# Patient Record
Sex: Female | Born: 1970 | Race: White | Hispanic: No | Marital: Single | Smoking: Former smoker
Health system: Southern US, Community
[De-identification: ages and names within clinical notes are randomized; demographics above are authoritative.]

## PROBLEM LIST (undated history)

## (undated) DIAGNOSIS — K9041 Non-celiac gluten sensitivity: Secondary | ICD-10-CM

## (undated) DIAGNOSIS — Z87891 Personal history of nicotine dependence: Secondary | ICD-10-CM

## (undated) DIAGNOSIS — I10 Essential (primary) hypertension: Secondary | ICD-10-CM

## (undated) DIAGNOSIS — F329 Major depressive disorder, single episode, unspecified: Secondary | ICD-10-CM

## (undated) DIAGNOSIS — Z973 Presence of spectacles and contact lenses: Secondary | ICD-10-CM

## (undated) DIAGNOSIS — S82409A Unspecified fracture of shaft of unspecified fibula, initial encounter for closed fracture: Secondary | ICD-10-CM

## (undated) DIAGNOSIS — Z87442 Personal history of urinary calculi: Secondary | ICD-10-CM

## (undated) DIAGNOSIS — J189 Pneumonia, unspecified organism: Secondary | ICD-10-CM

## (undated) DIAGNOSIS — N946 Dysmenorrhea, unspecified: Secondary | ICD-10-CM

## (undated) DIAGNOSIS — S8990XA Unspecified injury of unspecified lower leg, initial encounter: Secondary | ICD-10-CM

## (undated) DIAGNOSIS — F32A Depression, unspecified: Secondary | ICD-10-CM

## (undated) HISTORY — DX: Pneumonia, unspecified organism: J18.9

## (undated) HISTORY — PX: REDUCTION MAMMAPLASTY: SUR839

## (undated) HISTORY — DX: Depression, unspecified: F32.A

## (undated) HISTORY — DX: Non-celiac gluten sensitivity: K90.41

## (undated) HISTORY — DX: Personal history of nicotine dependence: Z87.891

## (undated) HISTORY — PX: MOUTH SURGERY: SHX715

## (undated) HISTORY — DX: Unspecified fracture of shaft of unspecified fibula, initial encounter for closed fracture: S82.409A

## (undated) HISTORY — DX: Dysmenorrhea, unspecified: N94.6

## (undated) HISTORY — DX: Personal history of urinary calculi: Z87.442

## (undated) HISTORY — DX: Major depressive disorder, single episode, unspecified: F32.9

---

## 1998-06-05 ENCOUNTER — Other Ambulatory Visit: Admission: RE | Admit: 1998-06-05 | Discharge: 1998-06-05 | Payer: Self-pay | Admitting: Obstetrics and Gynecology

## 1999-03-17 HISTORY — PX: BREAST REDUCTION SURGERY: SHX8

## 1999-06-10 ENCOUNTER — Other Ambulatory Visit: Admission: RE | Admit: 1999-06-10 | Discharge: 1999-06-10 | Payer: Self-pay | Admitting: Obstetrics and Gynecology

## 1999-11-25 ENCOUNTER — Ambulatory Visit (HOSPITAL_BASED_OUTPATIENT_CLINIC_OR_DEPARTMENT_OTHER): Admission: RE | Admit: 1999-11-25 | Discharge: 1999-11-25 | Payer: Self-pay | Admitting: Plastic Surgery

## 1999-11-25 ENCOUNTER — Encounter (INDEPENDENT_AMBULATORY_CARE_PROVIDER_SITE_OTHER): Payer: Self-pay | Admitting: Specialist

## 2000-03-02 ENCOUNTER — Encounter (INDEPENDENT_AMBULATORY_CARE_PROVIDER_SITE_OTHER): Payer: Self-pay

## 2000-03-02 ENCOUNTER — Other Ambulatory Visit: Admission: RE | Admit: 2000-03-02 | Discharge: 2000-03-02 | Payer: Self-pay | Admitting: Plastic Surgery

## 2000-06-29 ENCOUNTER — Other Ambulatory Visit: Admission: RE | Admit: 2000-06-29 | Discharge: 2000-06-29 | Payer: Self-pay | Admitting: Obstetrics and Gynecology

## 2001-06-30 ENCOUNTER — Other Ambulatory Visit: Admission: RE | Admit: 2001-06-30 | Discharge: 2001-06-30 | Payer: Self-pay | Admitting: Obstetrics and Gynecology

## 2004-10-29 ENCOUNTER — Encounter: Admission: RE | Admit: 2004-10-29 | Discharge: 2004-10-29 | Payer: Self-pay | Admitting: Obstetrics and Gynecology

## 2006-05-10 ENCOUNTER — Ambulatory Visit: Payer: Self-pay | Admitting: Internal Medicine

## 2006-07-09 ENCOUNTER — Ambulatory Visit: Payer: Self-pay | Admitting: Internal Medicine

## 2006-07-09 LAB — CONVERTED CEMR LAB
BUN: 6 mg/dL (ref 6–23)
Basophils Absolute: 0 10*3/uL (ref 0.0–0.1)
Basophils Relative: 0.1 % (ref 0.0–1.0)
Bilirubin, Direct: 0.1 mg/dL (ref 0.0–0.3)
Calcium: 8.8 mg/dL (ref 8.4–10.5)
Chloride: 108 meq/L (ref 96–112)
Eosinophils Absolute: 0.1 10*3/uL (ref 0.0–0.6)
HDL: 43.3 mg/dL (ref >39.0)
Lymphocytes Relative: 38.3 % (ref 12.0–46.0)
MCHC: 34.6 g/dL (ref 30.0–36.0)
MCV: 92.3 fL (ref 78.0–100.0)
Monocytes Absolute: 0.3 10*3/uL (ref 0.2–0.7)
Monocytes Relative: 6.7 % (ref 3.0–11.0)
Neutrophils Relative %: 53.3 % (ref 43.0–77.0)
Platelets: 283 10*3/uL (ref 150–400)
RBC: 4.56 M/uL (ref 3.87–5.11)
TSH: 1.88 microintl units/mL (ref 0.35–5.50)
WBC: 4.9 10*3/uL (ref 4.5–10.5)

## 2006-07-16 ENCOUNTER — Ambulatory Visit: Payer: Self-pay | Admitting: Internal Medicine

## 2006-08-24 ENCOUNTER — Other Ambulatory Visit: Admission: RE | Admit: 2006-08-24 | Discharge: 2006-08-24 | Payer: Self-pay | Admitting: Internal Medicine

## 2006-08-24 ENCOUNTER — Encounter: Payer: Self-pay | Admitting: Internal Medicine

## 2006-08-24 ENCOUNTER — Ambulatory Visit: Payer: Self-pay | Admitting: Internal Medicine

## 2006-11-05 ENCOUNTER — Ambulatory Visit: Payer: Self-pay | Admitting: Internal Medicine

## 2006-11-05 DIAGNOSIS — Z8659 Personal history of other mental and behavioral disorders: Secondary | ICD-10-CM

## 2006-11-05 DIAGNOSIS — N946 Dysmenorrhea, unspecified: Secondary | ICD-10-CM | POA: Insufficient documentation

## 2006-11-05 DIAGNOSIS — N926 Irregular menstruation, unspecified: Secondary | ICD-10-CM

## 2006-11-05 DIAGNOSIS — F329 Major depressive disorder, single episode, unspecified: Secondary | ICD-10-CM

## 2006-12-28 ENCOUNTER — Ambulatory Visit: Payer: Self-pay | Admitting: Internal Medicine

## 2006-12-28 DIAGNOSIS — L723 Sebaceous cyst: Secondary | ICD-10-CM | POA: Insufficient documentation

## 2006-12-28 DIAGNOSIS — L989 Disorder of the skin and subcutaneous tissue, unspecified: Secondary | ICD-10-CM | POA: Insufficient documentation

## 2007-02-28 ENCOUNTER — Ambulatory Visit: Payer: Self-pay | Admitting: Internal Medicine

## 2007-02-28 DIAGNOSIS — G4700 Insomnia, unspecified: Secondary | ICD-10-CM

## 2007-03-25 ENCOUNTER — Encounter: Payer: Self-pay | Admitting: Internal Medicine

## 2007-03-28 ENCOUNTER — Encounter: Payer: Self-pay | Admitting: Internal Medicine

## 2007-08-31 ENCOUNTER — Ambulatory Visit: Payer: Self-pay | Admitting: Internal Medicine

## 2007-08-31 DIAGNOSIS — M79609 Pain in unspecified limb: Secondary | ICD-10-CM

## 2007-08-31 DIAGNOSIS — M542 Cervicalgia: Secondary | ICD-10-CM | POA: Insufficient documentation

## 2007-09-12 ENCOUNTER — Ambulatory Visit: Payer: Self-pay | Admitting: Internal Medicine

## 2007-09-12 DIAGNOSIS — R209 Unspecified disturbances of skin sensation: Secondary | ICD-10-CM | POA: Insufficient documentation

## 2007-09-18 ENCOUNTER — Encounter: Admission: RE | Admit: 2007-09-18 | Discharge: 2007-09-18 | Payer: Self-pay | Admitting: Internal Medicine

## 2007-09-19 ENCOUNTER — Telehealth (INDEPENDENT_AMBULATORY_CARE_PROVIDER_SITE_OTHER): Payer: Self-pay | Admitting: *Deleted

## 2007-09-23 ENCOUNTER — Encounter: Payer: Self-pay | Admitting: Internal Medicine

## 2007-09-23 ENCOUNTER — Encounter: Admission: RE | Admit: 2007-09-23 | Discharge: 2007-09-23 | Payer: Self-pay | Admitting: Neurology

## 2008-03-21 ENCOUNTER — Telehealth: Payer: Self-pay | Admitting: *Deleted

## 2008-07-17 ENCOUNTER — Ambulatory Visit: Payer: Self-pay | Admitting: Internal Medicine

## 2008-07-17 LAB — CONVERTED CEMR LAB
ALT: 34 units/L (ref 0–35)
AST: 25 units/L (ref 0–37)
Alkaline Phosphatase: 46 units/L (ref 39–117)
Basophils Absolute: 0 10*3/uL (ref 0.0–0.1)
Bilirubin, Direct: 0.1 mg/dL (ref 0.0–0.3)
Eosinophils Absolute: 0.1 10*3/uL (ref 0.0–0.7)
Eosinophils Relative: 1.8 % (ref 0.0–5.0)
GFR calc non Af Amer: 99.28 mL/min (ref >60)
HCT: 40.4 % (ref 36.0–46.0)
LDL Cholesterol: 95 mg/dL (ref 0–99)
MCHC: 35.1 g/dL (ref 30.0–36.0)
Monocytes Absolute: 0.3 10*3/uL (ref 0.1–1.0)
Neutro Abs: 3.1 10*3/uL (ref 1.4–7.7)
Nitrite: POSITIVE
RBC: 4.27 M/uL (ref 3.87–5.11)
RDW: 11.7 % (ref 11.5–14.6)
Sodium: 140 meq/L (ref 135–145)
Specific Gravity, Urine: 1.01
TSH: 2.65 microintl units/mL (ref 0.35–5.50)
Total Bilirubin: 0.8 mg/dL (ref 0.3–1.2)
Total Protein: 6.2 g/dL (ref 6.0–8.3)
Triglycerides: 154 mg/dL — ABNORMAL HIGH (ref 0.0–149.0)
VLDL: 30.8 mg/dL (ref 0.0–40.0)
WBC Urine, dipstick: NEGATIVE
pH: 6

## 2008-07-23 ENCOUNTER — Ambulatory Visit: Payer: Self-pay | Admitting: Internal Medicine

## 2008-07-23 ENCOUNTER — Other Ambulatory Visit: Admission: RE | Admit: 2008-07-23 | Discharge: 2008-07-23 | Payer: Self-pay | Admitting: Internal Medicine

## 2008-07-23 ENCOUNTER — Encounter: Payer: Self-pay | Admitting: Internal Medicine

## 2008-07-23 DIAGNOSIS — E669 Obesity, unspecified: Secondary | ICD-10-CM

## 2008-08-29 ENCOUNTER — Ambulatory Visit: Payer: Self-pay | Admitting: Family Medicine

## 2008-08-29 DIAGNOSIS — N3000 Acute cystitis without hematuria: Secondary | ICD-10-CM

## 2008-08-29 LAB — CONVERTED CEMR LAB
Glucose, Urine, Semiquant: NEGATIVE
Ketones, urine, test strip: NEGATIVE

## 2008-08-30 ENCOUNTER — Encounter: Payer: Self-pay | Admitting: Family Medicine

## 2008-11-13 ENCOUNTER — Ambulatory Visit: Payer: Self-pay | Admitting: Internal Medicine

## 2008-11-13 DIAGNOSIS — R5381 Other malaise: Secondary | ICD-10-CM

## 2008-11-13 DIAGNOSIS — IMO0001 Reserved for inherently not codable concepts without codable children: Secondary | ICD-10-CM

## 2008-11-13 DIAGNOSIS — R5383 Other fatigue: Secondary | ICD-10-CM

## 2008-11-13 LAB — CONVERTED CEMR LAB
CO2: 25 meq/L (ref 19–32)
Creatinine, Ser: 0.7 mg/dL (ref 0.4–1.2)
Folate: >20 ng/mL
Free T4: 0.7 ng/dL (ref 0.6–1.6)
GFR calc non Af Amer: 99.11 mL/min (ref >60)
Potassium: 3.9 meq/L (ref 3.5–5.1)
T3, Free: 3.5 pg/mL (ref 2.3–4.2)
Thyroperoxidase Ab SerPl-aCnc: 86.9 — ABNORMAL HIGH (ref 0.0–60.0)
Tissue Transglutaminase Ab, IgA: 0.3 units (ref <7)
Total CK: 68 units/L (ref 7–177)

## 2008-11-15 ENCOUNTER — Telehealth: Payer: Self-pay | Admitting: *Deleted

## 2008-11-22 ENCOUNTER — Ambulatory Visit: Payer: Self-pay | Admitting: Internal Medicine

## 2008-11-22 DIAGNOSIS — F0630 Mood disorder due to known physiological condition, unspecified: Secondary | ICD-10-CM

## 2008-11-22 DIAGNOSIS — R946 Abnormal results of thyroid function studies: Secondary | ICD-10-CM | POA: Insufficient documentation

## 2009-01-30 ENCOUNTER — Ambulatory Visit: Payer: Self-pay | Admitting: Internal Medicine

## 2009-01-30 DIAGNOSIS — S8990XA Unspecified injury of unspecified lower leg, initial encounter: Secondary | ICD-10-CM | POA: Insufficient documentation

## 2009-01-30 DIAGNOSIS — S82409A Unspecified fracture of shaft of unspecified fibula, initial encounter for closed fracture: Secondary | ICD-10-CM | POA: Insufficient documentation

## 2009-01-30 DIAGNOSIS — S99929A Unspecified injury of unspecified foot, initial encounter: Secondary | ICD-10-CM

## 2009-01-30 DIAGNOSIS — S99919A Unspecified injury of unspecified ankle, initial encounter: Secondary | ICD-10-CM

## 2009-02-13 ENCOUNTER — Ambulatory Visit: Payer: Self-pay | Admitting: Internal Medicine

## 2009-02-28 ENCOUNTER — Encounter: Payer: Self-pay | Admitting: Internal Medicine

## 2009-03-29 ENCOUNTER — Encounter: Payer: Self-pay | Admitting: Internal Medicine

## 2009-05-07 ENCOUNTER — Ambulatory Visit: Payer: Self-pay | Admitting: Internal Medicine

## 2009-07-29 ENCOUNTER — Other Ambulatory Visit: Admission: RE | Admit: 2009-07-29 | Discharge: 2009-07-29 | Payer: Self-pay | Admitting: Internal Medicine

## 2009-07-29 ENCOUNTER — Ambulatory Visit: Payer: Self-pay | Admitting: Internal Medicine

## 2009-07-29 DIAGNOSIS — S6990XA Unspecified injury of unspecified wrist, hand and finger(s), initial encounter: Secondary | ICD-10-CM | POA: Insufficient documentation

## 2009-07-29 DIAGNOSIS — S6980XA Other specified injuries of unspecified wrist, hand and finger(s), initial encounter: Secondary | ICD-10-CM

## 2009-08-02 LAB — CONVERTED CEMR LAB: Pap Smear: NEGATIVE

## 2009-10-02 ENCOUNTER — Telehealth: Payer: Self-pay | Admitting: Internal Medicine

## 2009-10-18 ENCOUNTER — Ambulatory Visit: Payer: Self-pay | Admitting: Internal Medicine

## 2010-01-09 ENCOUNTER — Ambulatory Visit: Payer: Self-pay | Admitting: Internal Medicine

## 2010-04-02 ENCOUNTER — Ambulatory Visit
Admission: RE | Admit: 2010-04-02 | Discharge: 2010-04-02 | Payer: Self-pay | Source: Home / Self Care | Attending: Internal Medicine | Admitting: Internal Medicine

## 2010-04-15 NOTE — Letter (Signed)
Summary: [facility]  [facility]   Imported By: Sherian Rein 04/08/2009 13:02:09  _____________________________________________________________________  External Attachment:    Type:   Image     Comment:   External Document

## 2010-04-15 NOTE — Letter (Signed)
Summary: [facility]  [facility]   Imported By: Maryln Gottron 03/20/2009 10:25:45  _____________________________________________________________________  External Attachment:    Type:   Image     Comment:   External Document

## 2010-04-15 NOTE — Progress Notes (Signed)
Summary: Period on depo  Phone Note Call from Patient Call back at Home Phone [phone]   Caller: Patient Summary of Call: Pt left a voicemail saying that she is on the depo shot but has started her period on 7/15 and it has been going on since then. I left a message for her to call us back. Initial call taken by: Romualdo Bolk, CMA Duncan Dull),  October 02, 2009 8:37 AM  Follow-up for Phone Call        Spoke to pt- Pt has been on Depo since last year. She hasn't had her period since been on Depo. Pt started period on 7/15. It is not heavy. She has been wearing tampons, dark and painful. Pt states that it is like a normal period. Pt's next depo is due Aug 5th. Pt states that she is under alot of stress. Pt has been on Depo in the past and this never happened before. Follow-up by: Romualdo Bolk, CMA Duncan Dull),  October 02, 2009 4:56 PM  Additional Follow-up for Phone Call Additional follow up Details #1::        Per Dr. Fabian Sharp- Call if more than 10-14 days but should be okay.  Pt is not using this for contraception. Pt to call us back if she is having this for more than 10-14 days. Additional Follow-up by: Romualdo Bolk, CMA (AAMA),  October 02, 2009 5:20 PM

## 2010-04-15 NOTE — Assessment & Plan Note (Signed)
Summary: DEPO INJ//SLM  Nurse Visit   Vital Signs:  Patient profile:   39 year old female Menstrual status:  depo LMP:     09/27/2009 Weight:      174 pounds Pulse rate:   78 / minute BP sitting:   120 / 80  (right arm)  Vitals Entered By: Romualdo Bolk, CMA (AAMA) (October 18, 2009 11:07 AM) CC: Pt states that she did have a period on 7/15-7/20 and has been under stress. Pt states that she thinks this brought on her period. She also changed Cymbalta from 60mg  to 90mg  LMP (date): 09/27/2009 LMP - Character: light Menarche (age onset years): 9   Menses interval (days): 84 Menstrual flow (days): 1-2 Enter LMP: 09/27/2009 Last PAP Result NEGATIVE FOR INTRAEPITHELIAL LESIONS OR MALIGNANCY.   Preventive Screening-Counseling & Management  Alcohol-Tobacco     Alcohol drinks/day: <1     Alcohol type: wine     Smoking Status: quit     Year Quit: 8 years     Passive Smoke Exposure: no  Caffeine-Diet-Exercise     Caffeine use/day: <1     Does Patient Exercise: yes     Type of exercise: treadmill, walking     Times/week: 4     Depression Counseling: further diagnostic testing and/or other treatment is indicated  Current Medications (verified): 1)  Depo-Provera 150 Mg/ml Susp (Medroxyprogesterone Acetate) 2)  Lamictal 100 Mg  Tabs (Lamotrigine) .Marland Kitchen.. 1  By Mouth Qam and 1 By Mouth At Bedtime. Dr. Nolen Mu 3)  Clonazepam 0.5 Mg  Tabs (Clonazepam) .... 1/2 To 1 Once Daily Dr. Nolen Mu 4)  Cymbalta 30 Mg Cpep (Duloxetine Hcl) .... 3 By Mouth Once Daily  Allergies (verified): 1)  ! Penicillin 2)  ! * Gluten  Patient Instructions: 1)  Next Depo Due 01/09/2010   Medication Administration  Injection # 1:    Medication: Depo-Provera 150mg     Diagnosis: CONTRACEPTIVE MANAGEMENT (ICD-V25.09)    Route: IM    Site: RUOQ gluteus    Exp Date: 05/14/2012    Lot #: [id number]    Mfr: [email or web address]    Patient tolerated injection without complications    Given by: Romualdo Bolk,  CMA (AAMA) (October 18, 2009 11:14 AM)  Orders Added: 1)  Depo-Provera 150mg  [J1055] 2)  Admin of Therapeutic Inj  intramuscular or subcutaneous [96372] 3)  Est. Patient Level I [98119]  Impression & Recommendations:  Problem # 1:  IRREGULAR MENSTRUATION (ICD-626.4) prob frrom the depo  no intervention  needed . MOnitor   menses and call as needed concerns  Her updated medication list for this problem includes:    Depo-provera 150 Mg/ml Susp (Medroxyprogesterone acetate)  Problem # 2:  CONTRACEPTIVE MANAGEMENT (ICD-V25.09)  Orders: Depo-Provera 150mg  (J1055) Admin of Therapeutic Inj  intramuscular or subcutaneous (14782)  Complete Medication List: 1)  Depo-provera 150 Mg/ml Susp (Medroxyprogesterone acetate) 2)  Lamictal 100 Mg Tabs (Lamotrigine) .Marland Kitchen.. 1  by mouth qam and 1 by mouth at bedtime. dr. Nolen Mu 3)  Clonazepam 0.5 Mg Tabs (Clonazepam) .... 1/2 to 1 once daily dr. Nolen Mu 4)  Cymbalta 30 Mg Cpep (Duloxetine hcl) .... 3 by mouth once daily   History of Present Illness: see nursing hx .   advice requested and hx reviewed .   Ot denies risk of pregnancy.

## 2010-04-15 NOTE — Assessment & Plan Note (Signed)
Summary: fup/ccm   Vital Signs:  Patient profile:   40 year old female Menstrual status:  depo Height:      59.5 inches Weight:      154.6 pounds Temp:     98.2 degrees F oral Pulse rate:   86 / minute BP sitting:   110 / 80  (right arm)  Vitals Entered By: Kathrynn Speed CMA (Jul 29, 2009 3:22 PM) CC: CPX with pap- Pt smashed finger in door   History of Present Illness: Elizabeth Tanner comesin comes in today  for preventive visit . Since last visit  here  there have been no major changes in health status  . She is doing better on  new med changes and depression  per Valinda Hoar.   She injured smashed her left index finger  3 weels ago and bruised and slightly ttender  . using peroxide soaks . hx of oozing  .  On depo and is amenorrheic and prefers this .    Preventive Care Screening  Prior Values:    Pap Smear:  NEGATIVE FOR INTRAEPITHELIAL LESIONS OR MALIGNANCY. (07/23/2008)    Last Tetanus Booster:  Tdap (07/23/2008)   Preventive Screening-Counseling & Management  Alcohol-Tobacco     Alcohol drinks/day: <1     Alcohol type: wine     Smoking Status: quit     Year Quit: 8 years     Passive Smoke Exposure: no  Caffeine-Diet-Exercise     Caffeine use/day: <1     Does Patient Exercise: yes     Type of exercise: treadmill, walking     Times/week: 4     Depression Counseling: further diagnostic testing and/or other treatment is indicated  Hep-HIV-STD-Contraception     Dental Visit-last 6 months no     Sun Exposure-Excessive: yes     Sun Exposure Counseling: to decrease sun exposure  Safety-Violence-Falls     Seat Belt Use: yes     Firearms in the Home: no firearms in the home     Smoke Detectors: yes  Comments: financial ,         Blood Transfusions:  no.   also on cymbalta 60 mg  Current Medications (verified): 1)  Depo-Provera 150 Mg/ml Susp (Medroxyprogesterone Acetate) 2)  Lamictal 100 Mg  Tabs (Lamotrigine) .Marland Kitchen.. 1  By Mouth Qam and 1 By Mouth At  Bedtime. Dr. Nolen Mu 3)  Clonazepam 0.5 Mg  Tabs (Clonazepam) .... 1/2 To 1 Once Daily Dr. Nolen Mu  Allergies (verified): 1)  ! Penicillin 2)  ! * Gluten  Past History:  Past medical, surgical, family and social histories (including risk factors) reviewed, and no changes noted (except as noted below).  Past Medical History: Depression atypical? irreg menses dysmenorrhea  gluten sensitive  self reported  fracture fibula right 2010  Past Surgical History: Reviewed history from 08/31/2007 and no changes required. Breast Reduction 2001  Past History:  Care Management: Psychiatry: Valinda Hoar Couselor: Areta Haber Orthopedics: [place]  Family History: Reviewed history from 11/13/2008 and no changes required. Family History of Alcoholism/Addiction Family History of Arthritis Family History Thyroid disease father has" fused spine" and uncle with DJD    maternal uncle with Mi and electrical problem 'MAternal aunt electrical problem  melanoma in a cousin  GP ? pacer  ? hx autoimmune disease  Social History: Reviewed history from 11/22/2008 and no changes required. 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         left ankle still stiff and sore at times . check bumps vagina Physical Exam General Appearance: well developed, well nourished, no acute distress Eyes: conjunctiva and lids normal, PERRLA, EOMI,  WNL Ears, Nose, Mouth, Throat: TM clear, nares clear, oral exam WNL Neck: supple, no  lymphadenopathy, no thyromegaly, no JVD Respiratory: clear to auscultation and percussion, respiratory effort normal Cardiovascular: regular rate and rhythm, S1-S2, no murmur, rub or gallop, no bruits, peripheral pulses normal and symmetric, no cyanosis, clubbing, edema or varicosities Chest: no  masses, tenderness; no asymmetry, skin changes, nipple discharge   well healed scars  Gastrointestinal: soft, non-tender; no hepatosplenomegaly, masses; active bowel sounds all quadrants,  Genitourinary: slight white  vaginal discharge, lesions; no masses or tenderness  cx clear   bumps near labia look like glands  no  ulcers or lesions Lymphatic: no cervical, axillary or inguinal adenopathy Musculoskeletal: gait normal, muscle tone and strength WNL, no joint swelling, effusions, discoloration, crepitus  Skin: clear, good turgor, color WNL, no rashes, lesions, or ulcerations Neurologic: normal mental status, normal reflexes, normal strength, sensation, and motion Psychiatric: alert; oriented to person, place and time Other Exam:     Impression & Recommendations:  Problem # 1:  HEALTH MAINTENANCE EXAM, ADULT (ICD-V70.0) Discussed nutrition,exercise,diet,healthy weight, vitamin D and calcium.   avoid tanning beds. weight loss will help  Problem # 2:  Gynecological examination-routine (ICD-V72.31) PAP done    Problem # 3:  ANKLE INJURY, RIGHT (ICD-959.7) ankle  exercises and balance for reinjury prevention  Problem # 4:  INJURY OTHER AND UNSPECIFIED FINGER (ICD-959.5) left index  improving .Marland KitchenMarland KitchenMarland KitchenExpectant management nail will have to grow out.   Problem # 5:  DEPRESSION (ICD-311) Assessment: Improved on lamictal cymbalta and klonipen. Her updated medication list for this problem includes:    Clonazepam 0.5 Mg Tabs (Clonazepam) .Marland Kitchen... 1/2 to 1 once daily dr. Nolen Mu    Cymbalta 60 Mg Cpep (Duloxetine hcl) .Marland Kitchen... 1 by mouth once daily  Complete Medication List: 1)  Depo-provera 150 Mg/ml Susp  (Medroxyprogesterone acetate) 2)  Lamictal 100 Mg Tabs (Lamotrigine) .Marland Kitchen.. 1  by mouth qam and 1 by mouth at bedtime. dr. Nolen Mu 3)  Clonazepam 0.5 Mg Tabs (Clonazepam) .... 1/2 to 1 once daily dr. Nolen Mu 4)  Cymbalta 60 Mg Cpep (Duloxetine hcl) .Marland Kitchen.. 1 by mouth once daily  Other Orders: Depo-Provera 150mg  (Z6109) Admin of Therapeutic Inj  intramuscular or subcutaneous (60454) Pap Smear, Thin Prep ( Collection of) (U9811)  Patient Instructions: 1)  Next Depo Due Aug 7 2)  healthy lifestyle  3)  Avoid tanning beds to prevent skin cancer . 4)  range of motion exercises for your ankle  5)  You will be informed of lab results when available.  6)  yearly check labs next year    Medication Administration  Injection # 1:    Medication: Depo-Provera 150mg     Diagnosis: CONTRACEPTIVE MANAGEMENT (ICD-V25.09)    Route: IM    Site: RUOQ gluteus    Exp Date: 01/15/2012    Lot #: [id number]    Mfr: Francisca December    Patient tolerated injection without complications    Given by: Romualdo Bolk,  CMA (AAMA)  Orders Added: 1)  Depo-Provera 150mg  [J1055] 2)  Admin of Therapeutic Inj  intramuscular or subcutaneous [96372] 3)  Est. Patient 18-39 years [99395] 4)  Pap Smear, Thin Prep ( Collection of) [Q0091]

## 2010-04-15 NOTE — Assessment & Plan Note (Signed)
Summary: depo provera inj//ccm  Nurse Visit   Vital Signs:  Patient profile:   40 year old female Menstrual status:  depo Weight:      182 pounds Pulse rate:   72 / minute BP sitting:   120 / 80  (left arm) Cuff size:   regular  Vitals Entered By: Romualdo Bolk, CMA (AAMA) (January 09, 2010 9:24 AM) CC: Pt states that she is not having any side effects from the depo, no ha's, no nausea etc. Pt also states that she didn't have a period this month. Romualdo Bolk, CMA   Allergies: 1)  ! Penicillin 2)  ! * Gluten  Patient Instructions: 1)  Next Depo Due 04/02/2010   Medication Administration  Injection # 1:    Medication: Depo-Provera 150mg     Diagnosis: CONTRACEPTIVE MANAGEMENT (ICD-V25.09)    Route: IM    Site: RUOQ gluteus    Exp Date: 02/14/2012    Lot #: [id number]    Mfr: [email or web address]    Patient tolerated injection without complications    Given by: Romualdo Bolk, CMA (AAMA) (January 09, 2010 9:24 AM)  Orders Added: 1)  Depo-Provera 150mg  [J1055] 2)  Admin of Therapeutic Inj  intramuscular or subcutaneous [04540]

## 2010-04-15 NOTE — Assessment & Plan Note (Signed)
Summary: depo inj/njr  Nurse Visit   Vital Signs:  Patient profile:   40 year old female Menstrual status:  depo Weight:      155 pounds BMI:     30.89 Temp:     98.5 degrees F oral BP sitting:   98 / 66  (left arm)  Vitals Entered By: Gladis Riffle, RN (May 07, 2009 4:34 PM)  Allergies: 1)  ! Penicillin 2)  ! * Gluten  Medication Administration  Injection # 1:    Medication: Depo-Provera 150mg     Diagnosis: CONTRACEPTIVE MANAGEMENT (ICD-V25.09)    Route: IM    Site: RUOQ gluteus    Exp Date: 07/15/2011    Lot #: [id number]    Mfr: [email or web address]    Patient tolerated injection without complications    Given by: Gladis Riffle, RN (May 07, 2009 4:41 PM)  Orders Added: 1)  Depo-Provera 150mg  [J1055] 2)  Admin of Therapeutic Inj  intramuscular or subcutaneous [96372]   Medication Administration  Injection # 1:    Medication: Depo-Provera 150mg     Diagnosis: CONTRACEPTIVE MANAGEMENT (ICD-V25.09)    Route: IM    Site: RUOQ gluteus    Exp Date: 07/15/2011    Lot #: [id number]    Mfr: greenstone    Patient tolerated injection without complications    Given by: Gladis Riffle, RN (May 07, 2009 4:41 PM)  Orders Added: 1)  Depo-Provera 150mg  [J1055] 2)  Admin of Therapeutic Inj  intramuscular or subcutaneous [40102]

## 2010-04-17 NOTE — Assessment & Plan Note (Signed)
Summary: DEPO INJ // RS  Nurse Visit   Vital Signs:  Patient profile:   40 year old female Menstrual status:  depo Height:      59.5 inches Weight:      190 pounds BMI:     37.87 Pulse rate:   66 / minute BP sitting:   120 / 80  (left arm) Cuff size:   regular  Vitals Entered By: Romualdo Bolk, CMA (AAMA) (April 02, 2010 4:27 PM) CC: Depo Inj- No side effect   Patient Instructions: 1)  Next Depo Due 06/25/10 2)  Next Pap Due 07/30/10   Allergies: 1)  ! Penicillin 2)  ! * Gluten  Medication Administration  Injection # 1:    Medication: Depo-Provera 150mg     Diagnosis: CONTRACEPTIVE MANAGEMENT (ICD-V25.09)    Route: IM    Site: RUOQ gluteus    Exp Date: 07/14/2012    Lot #: [id number]    Mfr: greenstone    Patient tolerated injection without complications    Given by: Romualdo Bolk, CMA (AAMA) (April 02, 2010 4:29 PM)  Orders Added: 1)  Depo-Provera 150mg  [J1055] 2)  Admin of Therapeutic Inj  intramuscular or subcutaneous [[id number]]

## 2010-06-25 ENCOUNTER — Ambulatory Visit (INDEPENDENT_AMBULATORY_CARE_PROVIDER_SITE_OTHER): Payer: BC Managed Care – PPO | Admitting: Internal Medicine

## 2010-06-25 VITALS — BP 120/80 | HR 60 | Wt 198.0 lb

## 2010-06-25 DIAGNOSIS — Z309 Encounter for contraceptive management, unspecified: Secondary | ICD-10-CM

## 2010-06-25 MED ORDER — MEDROXYPROGESTERONE ACETATE 150 MG/ML IM SUSP
150.0000 mg | Freq: Once | INTRAMUSCULAR | Status: AC
  Administered 2010-06-25: 150 mg via INTRAMUSCULAR

## 2010-06-25 NOTE — Patient Instructions (Signed)
Next Depo July 3rd

## 2010-07-16 ENCOUNTER — Ambulatory Visit (INDEPENDENT_AMBULATORY_CARE_PROVIDER_SITE_OTHER): Payer: BC Managed Care – PPO | Admitting: Internal Medicine

## 2010-07-16 ENCOUNTER — Encounter: Payer: Self-pay | Admitting: Internal Medicine

## 2010-07-16 VITALS — BP 120/80 | HR 88 | Temp 99.2°F | Wt 200.0 lb

## 2010-07-16 DIAGNOSIS — Z3049 Encounter for surveillance of other contraceptives: Secondary | ICD-10-CM

## 2010-07-16 DIAGNOSIS — Z3042 Encounter for surveillance of injectable contraceptive: Secondary | ICD-10-CM

## 2010-07-16 DIAGNOSIS — R109 Unspecified abdominal pain: Secondary | ICD-10-CM | POA: Insufficient documentation

## 2010-07-16 DIAGNOSIS — R1030 Lower abdominal pain, unspecified: Secondary | ICD-10-CM

## 2010-07-16 LAB — POCT URINALYSIS DIPSTICK
Glucose, UA: NEGATIVE
Protein, UA: NEGATIVE
pH, UA: 6.5

## 2010-07-16 MED ORDER — KETOROLAC TROMETHAMINE 60 MG/2ML IM SOLN
60.0000 mg | Freq: Once | INTRAMUSCULAR | Status: AC
  Administered 2010-07-16: 60 mg via INTRAMUSCULAR

## 2010-07-16 MED ORDER — HYDROCODONE-ACETAMINOPHEN 5-500 MG PO TABS: 1.0000 | ORAL_TABLET | ORAL | Status: DC | PRN

## 2010-07-16 NOTE — Patient Instructions (Signed)
starin urine drink fluids  Med for pain as tolerated.  Call tomorrow am  If not getting better advise plan Ct scan to check for stone. And plan follow up.

## 2010-07-16 NOTE — Progress Notes (Signed)
  Subjective:    Patient ID: Elizabeth Tanner, female    DOB: [birth year], 40 y.o.   MRN: [id number]  HPI Patient comes in today for an acute problem all with a friend who drove her.  She had the somewhat acute onset of left flank pain to radiate around to her abdomen for a few days. She states this feels like when she thought she had a kidney stone. At that time it wasn't documented but she passed this down out of her bladder and her pain resolved.   She states that the ongoing pain is about 6-7/10 and when it is bad 10 out of 10. There is no nausea vomiting fever no urinary tract infection symptoms. He was not sure what to take for pain.  She doesn't think it's from a back strain because this pain feels different.   Review of Systems Negative for chest pain shortness of breath numbness unusual rashes like shingles pulmonary symptoms. No change in bowel habits. Periods are irregular she is on Depo-Provera and no risk of pregnancy. Rest as per history of present illness Past Medical History  Diagnosis Date  . Depression     ? atypical  . Dysmenorrhea   . Gluten intolerance     self reported  . Fracture of fibula     rt   Past Surgical History  Procedure Date  . Breast reduction surgery 2001    reports that she has quit smoking. She does not have any smokeless tobacco history on file. She reports that she drinks alcohol. She reports that she does not use illicit drugs. family history includes Alcohol abuse in an unspecified family member; Arthritis in an unspecified family member; Heart attack in her maternal uncle; Melanoma in her cousin; and Thyroid disease in an unspecified family member. Allergies  Allergen Reactions  . Penicillins        Objective:   Physical Exam Well-developed well-nourished in mild  To moddistress she appears to be more comfortable when she moves lays down or we touch her left flank and lower back. HEENT is normal grossly Neck supple without masses or  thyromegaly Chest CTA BS =  no wheezes rales or rhonchi CV rr no g or m nl perfusion No clubbing cyanosis or edema  Abdomen:  Sof,t normal bowel sounds without hepatosplenomegaly, no guarding rebound or masses mild CVA tenderness Neurologic: Grossly nonfocal and intact. Skin: No unusual rashes vesicles hives. Psych:    Oriented and nl affect.  Urinalysis clear except for trace heme     Assessment & Plan:  Acute left back flank pain with radiation.  Presumed history of kidney stones. She has not had an imaging study we discussed doing this today however she wanted to wait and treat symptomatically and not scan unless she doesn't pass it or did not get better. Discussed risk benefit.  We'll give her injectable Toradol   and some small amount of Vicodin   to increase her liquids strain her urine and call us tomorrow. Low threshold to get a renal CT to check for stones.  Patient aware and counseled

## 2010-07-30 ENCOUNTER — Encounter: Payer: Self-pay | Admitting: Internal Medicine

## 2010-07-30 ENCOUNTER — Ambulatory Visit (INDEPENDENT_AMBULATORY_CARE_PROVIDER_SITE_OTHER): Payer: BC Managed Care – PPO | Admitting: Internal Medicine

## 2010-07-30 VITALS — BP 150/100 | HR 78 | Ht 59.0 in | Wt 198.0 lb

## 2010-07-30 DIAGNOSIS — Z0000 Encounter for general adult medical examination without abnormal findings: Secondary | ICD-10-CM | POA: Insufficient documentation

## 2010-07-30 DIAGNOSIS — R030 Elevated blood-pressure reading, without diagnosis of hypertension: Secondary | ICD-10-CM

## 2010-07-30 DIAGNOSIS — Z3042 Encounter for surveillance of injectable contraceptive: Secondary | ICD-10-CM

## 2010-07-30 DIAGNOSIS — E669 Obesity, unspecified: Secondary | ICD-10-CM

## 2010-07-30 DIAGNOSIS — F0630 Mood disorder due to known physiological condition, unspecified: Secondary | ICD-10-CM

## 2010-07-30 LAB — LIPID PANEL
Triglycerides: 194 mg/dL — ABNORMAL HIGH (ref 0.0–149.0)
VLDL: 38.8 mg/dL (ref 0.0–40.0)

## 2010-07-30 LAB — BASIC METABOLIC PANEL
CO2: 23 mEq/L (ref 19–32)
Calcium: 9.7 mg/dL (ref 8.4–10.5)
Chloride: 107 mEq/L (ref 96–112)
GFR: 99.89 mL/min (ref >60.00)
Glucose, Bld: 82 mg/dL (ref 70–99)
Sodium: 142 mEq/L (ref 135–145)

## 2010-07-30 LAB — CBC WITH DIFFERENTIAL/PLATELET
Basophils Relative: 0.4 % (ref 0.0–3.0)
Eosinophils Relative: 1.8 % (ref 0.0–5.0)
HCT: 43.6 % (ref 36.0–46.0)
Lymphocytes Relative: 35.3 % (ref 12.0–46.0)
Lymphs Abs: 2.3 10*3/uL (ref 0.7–4.0)
MCHC: 34.8 g/dL (ref 30.0–36.0)
MCV: 93.9 fl (ref 78.0–100.0)
RBC: 4.65 Mil/uL (ref 3.87–5.11)
RDW: 13.3 % (ref 11.5–14.6)
WBC: 6.5 10*3/uL (ref 4.5–10.5)

## 2010-07-30 LAB — HEPATIC FUNCTION PANEL
Bilirubin, Direct: 0 mg/dL (ref 0.0–0.3)
Total Protein: 6.8 g/dL (ref 6.0–8.3)

## 2010-07-30 NOTE — Progress Notes (Signed)
  Subjective:    Patient ID: Elizabeth Tanner, female    DOB: [birth year], 40 y.o.   MRN: [id number]  HPI Patient comes in for PV today. Since her last visit her back pain resoved suddently . Thinks she passed a stone  Didn't see one but had grainy musous in her urine and then got better. No NVD  Has stopped her low dose Abilify with concern it could cause renal stones.   Bp up cause had some upsetting news from work today on the way over but usually normal readings. Last pap 5 11 and normal and no new risks felt low No mammo yet.   Review of Systems Pressure feeling being left eye.  Family hx of  Glaucoma  No vision change.  No cp sob.    No  Change in bowel habit.   Ankle  Better  From prev fracture injury.   Now.  Walk the  Dog 20 minutes  .   Sweats from cymbalta.    Sleep comes and goes.    Some flushes.     Objective:   Physical Exam Physical Exam: Vital signs reviewed NFA:OZHY is a well-developed well-nourished alert cooperative  white female who appears her stated age in no acute distress.  But a bit anxious  And upset but cognition is nl  HEENT: normocephalic  traumatic , Eyes: PERRL EOM's full, conjunctiva clear, Nares: paten,t no deformity discharge or tenderness., Ears: no deformity EAC's clear TMs with normal landmarks. Mouth: clear OP, no lesions, edema.  Moist mucous membranes. Dentition in adequate repair. NECK: supple without masses, thyromegaly or bruits. CHEST/PULM:  Clear to auscultation and percussion breath sounds equal no wheeze , rales or rhonchi. No chest wall deformities or tenderness. Breast: normal by inspection . No dimpling, discharge, masses, tenderness or discharge . wellhealed scars  CV: PMI is nondisplaced, S1 S2 no gallops, murmurs, rubs. Peripheral pulses are full without delay.No JVD .  ABDOMEN: Bowel sounds normal nontender  No guard or rebound, no hepato splenomegal no CVA tenderness.  No hernia. Extremtities:  No clubbing cyanosis or edema, no acute  joint swelling or redness no focal atrophy NEURO:  Oriented x3, cranial nerves 3-12 appear to be intact, no obvious focal weakness,gait within normal limits no abnormal reflexes or asymmetrical SKIN: No acute rashes normal turgor, color, no bruising or petechiae. PSYCH: Oriented, good eye contact, no obvious depression, cognition and judgment appear normal.slightly anxious but nl speech. LN: no cervical axillary inguinal adenopathy      Assessment & Plan:  Preventive Health Care UTD   Disc mammo recs   Get baseline mammo as she is 39 . Psych and mood  Under Psych care  Concern about se of meds. Elevated BP readings today  At risk but could re reaction to external factors. Back pain better.  No confirmation that this was a stone but is doing well wo observation and further eval if recurring.  Obesity  Counseled.  Dysmenorrhea better on depo  Is amenorrheic at this time.

## 2010-07-30 NOTE — Patient Instructions (Signed)
check your BP readings at least 3 separate times   Call if consistently elevated.   140/90 or above. We'll notify you of labs when they are available. Check yearly FA are normal. Get a baseline mammogram. Increase activity exercise  to help reduce stress and help your heart health.

## 2010-07-31 ENCOUNTER — Encounter: Payer: Self-pay | Admitting: Internal Medicine

## 2010-07-31 DIAGNOSIS — E785 Hyperlipidemia, unspecified: Secondary | ICD-10-CM | POA: Insufficient documentation

## 2010-08-01 ENCOUNTER — Telehealth: Payer: Self-pay | Admitting: *Deleted

## 2010-08-01 DIAGNOSIS — E785 Hyperlipidemia, unspecified: Secondary | ICD-10-CM

## 2010-08-01 NOTE — Telephone Encounter (Signed)
Pt aware of results and will call back to schedule a lab and rov in 4-6 months. Order placed in EPIC.

## 2010-08-01 NOTE — Assessment & Plan Note (Signed)
Elizabeth Tanner                            Elizabeth Tanner   Elizabeth Tanner, Elizabeth Tanner                       MRN:          [id number]  DATE:05/10/2006                            DOB:          08-11-1970    CHIEF COMPLAINT:  New patient to establish, needs hormonal treatment.   HISTORY OF PRESENT ILLNESS:  Ms. Theall is a 40 year old former smoking,  single white female, comes in today for a first time visit.  Her  previous was through Dr. Sherron Monday at [facility] and  she is switching primary care doctors.  She had been on Depo Provera for  about 11-12 years for period regulation and dysmenorrhea, but decided to  go off because of concerns about bone health with prolonged use.  Over  the last year she has had now irregular periods, maybe only lasting 3-4  days, but is beginning to have some of the same symptoms she had  previously with dysmenorrhea and irregularity.  She wants to discuss  other options.  In the remote past, combined OCPs caused paleness,  nausea and inability to take them.  She is currently not in need of  contraception, does not feel that that is an issue.   PAST MEDICAL HISTORY:  See data base.  History of mood and depressive  symptoms, on fluoxetine and Wellbutrin for up to 10 years and doing well  with this.  She has a history of migraine headaches.  She has had a  history of an abnormal lipid panel and had not been checked recently.  Surgeries:  Breast reduction surgery in 2001.  Last Pap 2006, no  abnormal.  She is primiparous.  Unknown last tetanus shot.  She is on  her period today.   MEDICATIONS:  1. Wellbutrin SR, we believe it is 150 twice a day.  2. Prozac 10 mg a day.   ALLERGIES:  PENICILLIN and GLUTEN.   REVIEW OF SYSTEMS:  Negative for chest pain, shortness of breath.  GI,  GU as above.  She states that she has historically diagnosed celiac  disease where she was getting recurrent rashes and GI  symptoms and bone  aches in the remote past, and on a gluten elimination diet her symptoms  went away.  Otherwise noncontributory.   SOCIAL HISTORY:  Works as Engineer, structural, Arts administrator.  Does not get  enough exercise according to her.  Used to smoke, age 24 to 53 but not  any more.  Occasional alcohol.  Six to seven hours of sleep a night.  Lives at home with her pet dog.   FAMILY HISTORY:  Father with new-onset type 2 diabetes, aunts and uncles  the same.  Maternal grandparents: Elevated cholesterol, stroke, PGM  colon polyps.  Mom and twin sister hyperthyroidism as well as  grandmother.  No diagnosis of osteoporosis but mom has celiac disease  diagnosed we believe by blood work.  There is arthritis that runs in the  family also.   OBJECTIVE:  Height 59 inches, weight 175, pulse 66 and regular.  Blood  pressure 130/80.  PHYSICAL EXAMINATION:  Healthy appearing normal age looking 40 year old  lady in no acute distress.  HEENT:  Grossly normal.  NECK:  Supple, without masses, thyromegaly or bruit.  CHEST:  Clear to auscultation.  CARDIAC:  S1, S2, no gallops or murmurs.  Peripheral pulses present.  ABDOMEN:  Soft, no megaly, guarding or rebound.  SKIN:  On the left upper abdomen there is what looks like a small cystic  lesion with center head with some minimal inflammation.  It is  nontender, non-fluctuant.   IMPRESSION/PROBLEM:  1. Dysmenorrhea and irregularity, with history of same, with concern      about bone health and trying to switch with hormonal regulation.      Because she has stopped smoking and it has been awhile, we can try      other options.  We will try Seasonale 3 months with refill and take      the medicine at night to mitigate problems, and have a followup      with fasting lab work and a visit in 2-3 months.  2. Family history of type 2 diabetes and what sounds like dysmetabolic      problems, and according to her own history with a low HDL in her       lipids she may be at risk.  3. Mood, stable.  4. History of gluten sensitivity with symptom-diagnosed celiac      problems.  This certainly could be accurate, with her family      history of thyroid disease and her mother's diagnosis of celiac      disease.   PLAN:  Agreed with her lifestyle changes, to reduce weight and eat  healthy and not return to tobacco.  We will try Seasonale as above.  Will review her labs at her followup visit to look at other risk factors  and interventions in the meantime.     Neta Mends. Fabian Sharp, MD  Electronically Signed    WKP/MedQ  DD: 05/10/2006  DT: 05/10/2006  Job #: [id number]

## 2010-08-01 NOTE — Telephone Encounter (Signed)
Message copied by Tor Netters on Fri Aug 01, 2010  1:51 PM ------      Message from: [facility], [place] K      Created: Thu Jul 31, 2010  3:56 PM       Advise  Patient labs good except lipids are very high  .  Even if she want fasting. Her LDL was over 200.  With her family hx  She needs to get these readings lower.   May we get her to see a nutritionist for this problem?     Other wise intensify  lifestyle intervention healthy eating and exercise .             Consider medication therapy also .Marland Kitchen we can call in statin medication .      Repeat lipid panel in 4- 6 months fasting and then OV to discuss.

## 2010-09-16 ENCOUNTER — Encounter: Payer: Self-pay | Admitting: Internal Medicine

## 2010-09-16 ENCOUNTER — Ambulatory Visit (INDEPENDENT_AMBULATORY_CARE_PROVIDER_SITE_OTHER): Payer: BC Managed Care – PPO | Admitting: Internal Medicine

## 2010-09-16 VITALS — BP 120/82 | HR 66 | Wt 198.0 lb

## 2010-09-16 DIAGNOSIS — Z309 Encounter for contraceptive management, unspecified: Secondary | ICD-10-CM

## 2010-09-16 MED ORDER — MEDROXYPROGESTERONE ACETATE 150 MG/ML IM SUSP
150.0000 mg | Freq: Once | INTRAMUSCULAR | Status: AC
  Administered 2010-09-16: 150 mg via INTRAMUSCULAR

## 2010-09-16 NOTE — Patient Instructions (Signed)
Next Depo is due Sept 24th

## 2010-10-15 DIAGNOSIS — J189 Pneumonia, unspecified organism: Secondary | ICD-10-CM

## 2010-10-15 HISTORY — DX: Pneumonia, unspecified organism: J18.9

## 2010-10-27 ENCOUNTER — Ambulatory Visit (INDEPENDENT_AMBULATORY_CARE_PROVIDER_SITE_OTHER): Payer: BC Managed Care – PPO | Admitting: Family Medicine

## 2010-10-27 ENCOUNTER — Encounter: Payer: Self-pay | Admitting: Family Medicine

## 2010-10-27 VITALS — BP 120/80 | HR 123 | Temp 98.6°F | Wt 190.0 lb

## 2010-10-27 DIAGNOSIS — J40 Bronchitis, not specified as acute or chronic: Secondary | ICD-10-CM

## 2010-10-27 MED ORDER — AZITHROMYCIN 250 MG PO TABS: ORAL_TABLET | ORAL | Status: AC

## 2010-10-27 MED ORDER — HYDROCODONE-HOMATROPINE 5-1.5 MG/5ML PO SYRP: 5.0000 mL | ORAL_SOLUTION | ORAL | Status: DC | PRN

## 2010-10-27 NOTE — Progress Notes (Signed)
  Subjective:    Patient ID: Elizabeth Tanner, female    DOB: June 23, 1970, 40 y.o.   MRN: [id number]  HPI Here for one week of chest tightness, fever to 102 degrees, and coughing up brown sputum. Using Mucinex and Nyqiuil. She has been out of work since 10-21-10.   Review of Systems  Constitutional: Positive for fever.  HENT: Negative.   Eyes: Negative.   Respiratory: Positive for cough and shortness of breath.        Objective:   Physical Exam  Constitutional: She appears well-developed and well-nourished.  HENT:  Right Ear: External ear normal.  Left Ear: External ear normal.  Nose: Nose normal.  Mouth/Throat: Oropharynx is clear and moist. No oropharyngeal exudate.  Eyes: Conjunctivae are normal. Pupils are equal, round, and reactive to light.  Neck: No thyromegaly present.  Pulmonary/Chest: Effort normal. No respiratory distress. She has no wheezes. She has no rales. She exhibits no tenderness.       Scattered rhonchi   Lymphadenopathy:    She has no cervical adenopathy.          Assessment & Plan:  Treat as above. She will return to work from 10-29-10 through 10-31-10 working part time (4 hours a day), then she will return to fulltime on 11-03-10

## 2010-10-28 ENCOUNTER — Telehealth: Payer: Self-pay | Admitting: Internal Medicine

## 2010-10-28 MED ORDER — DOXYCYCLINE HYCLATE 100 MG PO TBEC: 100.0000 mg | DELAYED_RELEASE_TABLET | Freq: Two times a day (BID) | ORAL | Status: AC

## 2010-10-28 NOTE — Telephone Encounter (Signed)
Pt called again

## 2010-10-28 NOTE — Telephone Encounter (Signed)
Stop the Zithromax, and take 1-2 Benadryls q 4 hours prn itching. Call in Doxycycline 100 mg bid for 10 days, to start this tomorrow

## 2010-10-28 NOTE — Telephone Encounter (Signed)
Pt states she has no rash, but itches all over.. Started one hour after starting the antibiotic, and has never had any itching with hydrocodone before.

## 2010-10-28 NOTE — Telephone Encounter (Signed)
Spoke with pt and gave below message, script sent e-scribe.

## 2010-10-28 NOTE — Telephone Encounter (Signed)
Pt said that Azithromycin is making pt itch. Pt is req a different antibiotic to be called in to [facility] on [address].

## 2010-11-06 ENCOUNTER — Ambulatory Visit (INDEPENDENT_AMBULATORY_CARE_PROVIDER_SITE_OTHER): Payer: BC Managed Care – PPO | Admitting: Internal Medicine

## 2010-11-06 ENCOUNTER — Encounter: Payer: Self-pay | Admitting: Internal Medicine

## 2010-11-06 ENCOUNTER — Ambulatory Visit (INDEPENDENT_AMBULATORY_CARE_PROVIDER_SITE_OTHER)
Admission: RE | Admit: 2010-11-06 | Discharge: 2010-11-06 | Disposition: A | Discharge status: Home / Self Care | Payer: BC Managed Care – PPO | Source: Ambulatory Visit | Attending: Internal Medicine | Admitting: Internal Medicine

## 2010-11-06 ENCOUNTER — Telehealth: Payer: Self-pay | Admitting: *Deleted

## 2010-11-06 VITALS — BP 120/88 | HR 103 | Temp 97.8°F | Wt 192.0 lb

## 2010-11-06 DIAGNOSIS — T50905A Adverse effect of unspecified drugs, medicaments and biological substances, initial encounter: Secondary | ICD-10-CM

## 2010-11-06 DIAGNOSIS — J209 Acute bronchitis, unspecified: Secondary | ICD-10-CM

## 2010-11-06 DIAGNOSIS — R0600 Dyspnea, unspecified: Secondary | ICD-10-CM | POA: Insufficient documentation

## 2010-11-06 DIAGNOSIS — T887XXA Unspecified adverse effect of drug or medicament, initial encounter: Secondary | ICD-10-CM

## 2010-11-06 DIAGNOSIS — R0609 Other forms of dyspnea: Secondary | ICD-10-CM

## 2010-11-06 DIAGNOSIS — R0989 Other specified symptoms and signs involving the circulatory and respiratory systems: Secondary | ICD-10-CM

## 2010-11-06 DIAGNOSIS — Z87891 Personal history of nicotine dependence: Secondary | ICD-10-CM | POA: Insufficient documentation

## 2010-11-06 MED ORDER — HYDROCODONE-HOMATROPINE 5-1.5 MG/5ML PO SYRP: 5.0000 mL | ORAL_SOLUTION | ORAL | Status: AC | PRN

## 2010-11-06 MED ORDER — LEVOFLOXACIN 750 MG PO TABS: 750.0000 mg | ORAL_TABLET | Freq: Every day | ORAL | Status: AC

## 2010-11-06 NOTE — Telephone Encounter (Signed)
Pt aware and rx sent to pharmacy. 

## 2010-11-06 NOTE — Progress Notes (Signed)
  Subjective:    Patient ID: Elizabeth Tanner, female    DOB: Jun 04, 1970, 40 y.o.   MRN: [id number]  HPI  Patient comes if for acute visit cause   Of continued sx . Saw Dr Clent Ridges  On above day and was given z pack but had some itching felt from this with this and was changed to doxycycline.  Since that time.Exhausted and has no energy and poor sleep   Breathing is an issue. Hard to work.   Review : onset like a sinus infection and head cold and then down in chest and was hard to breath .   Heart racing.   Fever of 102 for 1 day and then low grade .  But cough persists.   Taking    Cough med.  phleg  Was brown and red spec  And now yellow.  About 80 % better but still tired and coughing. He is taking Mucinex and NyQuil some over-the-counter medicine with questionable help. She is a bit worried because a relative recently died of pneumonia.  Would like refill of cough med also  No hx of asthma  No tobacco   Had tobacco  19 - 27.  Years of [age]  Ppd.  Review of Systems No current fever or vision changes severe pain or wheezing nose congestion is better. Usual bruising.  Past history family history social history reviewed in the electronic medical record.     Objective:   Physical Exam WDWN in nad  Looks tired but  iin nad and has nl speech and quiet respiration. HEENT: Normocephalic ;atraumatic , Eyes;  PERRL, EOMs  Full, lids and conjunctiva clear,,Ears: no deformities, canals nl, TM landmarks normal, Nose: no deformity or discharge  Mouth : OP clear without lesion or edema . Chest:  Clear to A&P without wheezes rales or rhonchi CV:  S1-S2 no gallops or murmurs peripheral perfusion is normal No clubbing cyanosis or edema  Record review    Assessment & Plan:  Bronchitis  under antibiotic treatment probably bacterial. It is possible that she had a low-grade pneumonia.  She is improved although persistent symptoms. Ex smoker.  She is on an antibiotic that would treat outpatient  pneumonia.  A chest x-ray today.  Side effect from initial antibiotic Z-Pak ;she is pretty sure it is this and has never had a problem with codeine. She has the same reaction when she takes erythromycin.

## 2010-11-06 NOTE — Telephone Encounter (Signed)
Message copied by Romualdo Bolk on Thu Nov 06, 2010 12:46 PM ------      Message from: [facility], [place]      Created: Thu Nov 06, 2010 12:28 PM       Read patient result.  Look like she has had left side  pneumonia   . But unclear if getting better or not.   She is on a medication for pneumonia but we can change to levaquin 750 1 po qd disp 7.   And definitely  follow up if  Not resolving

## 2010-11-06 NOTE — Patient Instructions (Addendum)
Continued cough and fatigue could still be related to significant respiratory infection. Doxycycline is a good medication  for pneumonia. Data chest x-ray today and we will let to know the results. If it is normal continue activity as tolerated and call us or followup in a week if still not improving. Her oxygen level is normal today

## 2010-11-07 ENCOUNTER — Telehealth: Payer: Self-pay | Admitting: Internal Medicine

## 2010-11-07 ENCOUNTER — Other Ambulatory Visit: Payer: Self-pay

## 2010-11-07 NOTE — Telephone Encounter (Signed)
Pt left message on triage line. She was in this week and received medication for pneumonia. It is making her itch like crazy. Please call in a new med or call pt to advise.

## 2010-11-07 NOTE — Telephone Encounter (Signed)
Pt notified and verbalized understanding.

## 2010-11-07 NOTE — Telephone Encounter (Signed)
Go back to the doxycycline. Also she should speak with her pharmacist at some point or allergist. Elita Boone if she is allergic to some  Additive  To all of these meds tha cause itching  Instead of the active ingredient .  Remember that codeine derivatives can also cause itching.  Call us next week about how she is doing.

## 2010-11-07 NOTE — Telephone Encounter (Signed)
Pt was seen in office yesterday for pneumonia. Pt c/o the levaquin making her itch. Advised pt to d/c the levaquin. She notes that doxycyline was Rx'ed by Dr. Clent Ridges last week and she didn't have a problem with it and she still has it at home, so she will start taking it again if that's what you recommend. Please advise

## 2010-11-07 NOTE — Progress Notes (Signed)
Opened in error

## 2010-12-08 ENCOUNTER — Ambulatory Visit (INDEPENDENT_AMBULATORY_CARE_PROVIDER_SITE_OTHER): Payer: BC Managed Care – PPO | Admitting: Internal Medicine

## 2010-12-08 DIAGNOSIS — Z309 Encounter for contraceptive management, unspecified: Secondary | ICD-10-CM

## 2010-12-08 MED ORDER — MEDROXYPROGESTERONE ACETATE 150 MG/ML IM SUSP
150.0000 mg | Freq: Once | INTRAMUSCULAR | Status: AC
  Administered 2010-12-08: 150 mg via INTRAMUSCULAR

## 2010-12-23 ENCOUNTER — Encounter: Payer: Self-pay | Admitting: Internal Medicine

## 2010-12-23 ENCOUNTER — Ambulatory Visit (INDEPENDENT_AMBULATORY_CARE_PROVIDER_SITE_OTHER): Payer: BC Managed Care – PPO | Admitting: Internal Medicine

## 2010-12-23 ENCOUNTER — Other Ambulatory Visit: Payer: Self-pay | Admitting: Internal Medicine

## 2010-12-23 DIAGNOSIS — L659 Nonscarring hair loss, unspecified: Secondary | ICD-10-CM

## 2010-12-23 DIAGNOSIS — E069 Thyroiditis, unspecified: Secondary | ICD-10-CM

## 2010-12-23 DIAGNOSIS — E669 Obesity, unspecified: Secondary | ICD-10-CM

## 2010-12-23 DIAGNOSIS — F0630 Mood disorder due to known physiological condition, unspecified: Secondary | ICD-10-CM

## 2010-12-23 DIAGNOSIS — R946 Abnormal results of thyroid function studies: Secondary | ICD-10-CM

## 2010-12-23 DIAGNOSIS — R5381 Other malaise: Secondary | ICD-10-CM

## 2010-12-23 LAB — CBC WITH DIFFERENTIAL/PLATELET
Basophils Relative: 0.5 % (ref 0.0–3.0)
Eosinophils Relative: 1.6 % (ref 0.0–5.0)
HCT: 44.4 % (ref 36.0–46.0)
Hemoglobin: 14.9 g/dL (ref 12.0–15.0)
Lymphs Abs: 2.8 10*3/uL (ref 0.7–4.0)
MCV: 92.5 fl (ref 78.0–100.0)
Monocytes Absolute: 0.4 10*3/uL (ref 0.1–1.0)
Monocytes Relative: 5.5 % (ref 3.0–12.0)
Neutro Abs: 4.1 10*3/uL (ref 1.4–7.7)
Platelets: 371 10*3/uL (ref 150.0–400.0)
RBC: 4.8 Mil/uL (ref 3.87–5.11)
WBC: 7.5 10*3/uL (ref 4.5–10.5)

## 2010-12-23 LAB — HEMOGLOBIN A1C: Hgb A1c MFr Bld: 5.4 % (ref 4.6–6.5)

## 2010-12-23 LAB — VITAMIN B12: Vitamin B-12: 359 pg/mL (ref 211–911)

## 2010-12-23 NOTE — Patient Instructions (Signed)
Agree  With rechecking the labs Consider  Another  Endo consult but may not be the whole picture.  Avoid simple .  Simple sugars and carbs.  Then plan follow up.

## 2010-12-24 LAB — ANA: Anti Nuclear Antibody(ANA): NEGATIVE

## 2010-12-24 LAB — FOLATE: Folate: 9.1 ng/mL (ref >5.9)

## 2010-12-26 ENCOUNTER — Encounter: Payer: Self-pay | Admitting: *Deleted

## 2010-12-26 ENCOUNTER — Encounter: Payer: Self-pay | Admitting: Internal Medicine

## 2010-12-26 DIAGNOSIS — L659 Nonscarring hair loss, unspecified: Secondary | ICD-10-CM | POA: Insufficient documentation

## 2010-12-26 DIAGNOSIS — E069 Thyroiditis, unspecified: Secondary | ICD-10-CM | POA: Insufficient documentation

## 2010-12-26 LAB — THYROID PEROXIDASE ANTIBODY: Thyroperoxidase Ab SerPl-aCnc: <10 IU/mL (ref <35.0)

## 2010-12-26 NOTE — Progress Notes (Signed)
  Subjective:    Patient ID: Elizabeth Tanner, female    DOB: 1970-06-18, 40 y.o.   MRN: [id number]  HPI Pt comesin for concern about multiple medical sx .   Wants her thyroid check cause of this.  Fatigue increase sleeping tingling and hair falling out  . Weight gain and some anorexia.  No sig change  In bowel habits . No fever no change in meds  Mom was concerned about thyroid also,   Med list updated   Review of Systems Neg cp sob today fever bleeding nodules or acute joint changes hair feels thinner but no patchy alopecia   Feels  As if she is gluten sensitive.  Past history family history social history reviewed in the electronic medical record.     Objective:   Physical Exam WDWN in nad  Neck thyroid palpable Chest:  Clear to A&P without wheezes rales or rhonchi CV:  S1-S2 no gallops or murmurs peripheral perfusion is normal No clubbing cyanosis or edema Skin: normal capillary refill ,turgor , color: No acute rashes ,petechiae or bruising Neuro : grossly non focal.  Oriented x 3 and no noted deficits in memory, attention, and speech. No acute anxiety mildly subdued      Assessment & Plan:  Fatigue  Hair  thining Tingling Weight issues Depression/bipolar  Seems stable.   Repeat metabolic check but may not be related . Multi symptoms dont fit a given pattern.   Counseled.plan depending on lab

## 2010-12-29 ENCOUNTER — Telehealth: Payer: Self-pay | Admitting: Internal Medicine

## 2010-12-29 DIAGNOSIS — IMO0001 Reserved for inherently not codable concepts without codable children: Secondary | ICD-10-CM

## 2010-12-29 NOTE — Telephone Encounter (Signed)
Pt called req lab results. Pls call asap.   °

## 2010-12-30 ENCOUNTER — Other Ambulatory Visit: Payer: Self-pay | Admitting: Internal Medicine

## 2010-12-30 ENCOUNTER — Other Ambulatory Visit (INDEPENDENT_AMBULATORY_CARE_PROVIDER_SITE_OTHER): Payer: BC Managed Care – PPO

## 2010-12-30 DIAGNOSIS — R5383 Other fatigue: Secondary | ICD-10-CM

## 2010-12-30 DIAGNOSIS — R5381 Other malaise: Secondary | ICD-10-CM

## 2010-12-30 NOTE — Telephone Encounter (Signed)
Per Dr. Fabian Sharp- ok to do ebv panel.

## 2010-12-30 NOTE — Telephone Encounter (Signed)
Pt called and is req to come in to be tested for mono. Pt also req to get lab results.

## 2010-12-30 NOTE — Telephone Encounter (Signed)
Pt to come in now.

## 2010-12-30 NOTE — Telephone Encounter (Signed)
Letter sent to pt. Pt aware that her labs are normal. Pt is having pins and needles in feet, fatigue. She has been exposed to mono 3 weeks ago by niece. She is concerned that she now may have mono.

## 2011-01-01 ENCOUNTER — Telehealth: Payer: Self-pay | Admitting: *Deleted

## 2011-01-01 LAB — EBV AB TO VIRAL CAPSID AG PNL, IGG+IGM
EBV VCA IgG: 3.56 {ISR} — ABNORMAL HIGH
EBV VCA IgM: 0.15 {ISR}

## 2011-01-01 NOTE — Telephone Encounter (Signed)
Pt requesting lab results.

## 2011-01-02 ENCOUNTER — Telehealth: Payer: Self-pay | Admitting: Internal Medicine

## 2011-01-02 NOTE — Telephone Encounter (Signed)
See result note.  

## 2011-01-02 NOTE — Telephone Encounter (Signed)
Left a message for pt to return call 

## 2011-01-02 NOTE — Telephone Encounter (Signed)
Pt was in Tuesday for a mono test and is requesting results. Please contact pt

## 2011-01-02 NOTE — Progress Notes (Signed)
Quick Note:  Pt aware ______ 

## 2011-01-05 NOTE — Telephone Encounter (Signed)
Spoke with pt and she is aware.

## 2011-02-27 ENCOUNTER — Ambulatory Visit (INDEPENDENT_AMBULATORY_CARE_PROVIDER_SITE_OTHER): Payer: BC Managed Care – PPO | Admitting: Family Medicine

## 2011-02-27 DIAGNOSIS — Z309 Encounter for contraceptive management, unspecified: Secondary | ICD-10-CM

## 2011-02-27 MED ORDER — MEDROXYPROGESTERONE ACETATE 150 MG/ML IM SUSP
150.0000 mg | Freq: Once | INTRAMUSCULAR | Status: AC
  Administered 2011-02-27: 150 mg via INTRAMUSCULAR

## 2011-03-18 ENCOUNTER — Encounter: Payer: Self-pay | Admitting: Internal Medicine

## 2011-03-18 ENCOUNTER — Ambulatory Visit (INDEPENDENT_AMBULATORY_CARE_PROVIDER_SITE_OTHER): Payer: BC Managed Care – PPO | Admitting: Internal Medicine

## 2011-03-18 VITALS — BP 146/110 | HR 60 | Wt 192.0 lb

## 2011-03-18 DIAGNOSIS — Z5689 Other problems related to employment: Secondary | ICD-10-CM

## 2011-03-18 DIAGNOSIS — I10 Essential (primary) hypertension: Secondary | ICD-10-CM | POA: Insufficient documentation

## 2011-03-18 DIAGNOSIS — Z566 Other physical and mental strain related to work: Secondary | ICD-10-CM | POA: Insufficient documentation

## 2011-03-18 MED ORDER — TRIAMTERENE-HCTZ 37.5-25 MG PO TABS: 1.0000 | ORAL_TABLET | Freq: Every day | ORAL | Status: DC

## 2011-03-18 NOTE — Patient Instructions (Addendum)
Begin  Diuretic to  control bp  Begin with 1/2 pill per day for 3 weeks and if not controlled then increase to 1  Per day.   Continue to monitor and return office visit in 6-8 weeks or as needed.   Dash diet  Can help lower BP also .  Sodium-Controlled Diet Sodium is a mineral. It is found in many foods. Sodium may be found naturally or added during the making of a food. The most common form of sodium is salt, which is made up of sodium and chloride. Reducing your sodium intake involves changing your eating habits. The following guidelines will help you reduce the sodium in your diet:  Stop using the salt shaker.   Use salt sparingly in cooking and baking.   Substitute with sodium-free seasonings and spices.   Do not use a salt substitute (potassium chloride) without your caregiver's permission.   Include a variety of fresh, unprocessed foods in your diet.   Limit the use of processed and convenience foods that are high in sodium.  USE THE FOLLOWING FOODS SPARINGLY: Breads/Starches  Commercial bread stuffing, commercial pancake or waffle mixes, coating mixes. Waffles. Croutons. Prepared (boxed or frozen) potato, rice, or noodle mixes that contain salt or sodium. Salted [place] fries or hash browns. Salted popcorn, breads, crackers, chips, or snack foods.  Vegetables  Vegetables canned with salt or prepared in cream, butter, or cheese sauces. Sauerkraut. Tomato or vegetable juices canned with salt.   Fresh vegetables are allowed if rinsed thoroughly.  Fruit  Fruit is okay to eat.  Meat and Meat Substitutes  Salted or smoked meats, such as bacon or Canadian bacon, chipped or corned beef, hot dogs, salt pork, luncheon meats, pastrami, ham, or sausage. Canned or smoked fish, poultry, or meat. Processed cheese or cheese spreads, blue or Roquefort cheese. Battered or frozen fish products. Prepared spaghetti sauce. Baked beans. Reuben sandwiches. Salted nuts. Caviar.  Milk  Limit  buttermilk to 1 cup per week.  Soups and Combination Foods  Bouillon cubes, canned or dried soups, broth, consomm. Convenience (frozen or packaged) dinners with more than 600 mg sodium. Pot pies, pizza, Asian food, fast food cheeseburgers, and specialty sandwiches.  Desserts and Sweets  Regular (salted) desserts, pie, commercial fruit snack pies, commercial snack cakes, canned puddings.   Eat desserts and sweets in moderation.  Fats and Oils  Gravy mixes or canned gravy. No more than 1 to 2 tbs of salad dressing. Chip dips.   Eat fats and oils in moderation.  Beverages  See those listed under the vegetables and milk groups.  Condiments  Ketchup, mustard, meat sauces, salsa, regular (salted) and lite soy sauce or mustard. Dill pickles, olives, meat tenderizer. Prepared horseradish or pickle relish. Dutch-processed cocoa. Baking powder or baking soda used medicinally. Worcestershire sauce. "Light" salt. Salt substitute, unless approved by your caregiver.  Document Released: 08/22/2001 Document Revised: 11/12/2010 Document Reviewed: 03/25/2009 [facility] Patient Information 2012 [place], [place].

## 2011-03-18 NOTE — Progress Notes (Signed)
  Subjective:    Patient ID: Elizabeth Tanner, female    DOB: 1970/10/25, 41 y.o.   MRN: [id number]  HPI  Patient comes in today for SDA  For bp problem . Since her last visit she has been trying to monitor her blood pressure. She bought a wrist monitor and it has been very high in the 160/110 range. She took a reading on her parents blood pressure machine and was in the 140s over 90-100. Always up even at home and pulse also.   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  No chest pain or shortness of breath. . Has been trying to lose weight and changing her diet but most recently for control. Review of Systems No fever or chest pain shortness of breath syncope.  Past history family history social history reviewed in the electronic medical record.     Objective:   Physical Exam  Well-developed well-nourished in no acute distress looks well today. Neck: Supple without adenopathy or masses or bruits Chest:  Clear to A&P without wheezes rales or rhonchi CV:  S1-S2 no gallops or murmurs peripheral perfusion is normal Repeat blood pressure right arm 144/100 large cuff left arm 132/98 sitting psycn stable and nl eye contact today      Assessment & Plan:   hypertension documented continued elevation. Family history obesity contribute.   She is aware good attitude decreasing sodium and salt processed foods consider DASH diet Will begin low-dose diuretic TMP HCTZ  Increase as needed and follow up in 6-8 weeks.   reviewed lifestyle issues.  Cymbalta may  be minimally contributing to her elevated blood pressure but I do not think there is reason to stop or change it.   Total visit > 50% spent counseling and coordinating care

## 2011-04-09 ENCOUNTER — Ambulatory Visit (INDEPENDENT_AMBULATORY_CARE_PROVIDER_SITE_OTHER)
Admission: RE | Admit: 2011-04-09 | Discharge: 2011-04-09 | Disposition: A | Discharge status: Home / Self Care | Payer: BC Managed Care – PPO | Source: Ambulatory Visit | Attending: Internal Medicine | Admitting: Internal Medicine

## 2011-04-09 ENCOUNTER — Encounter: Payer: Self-pay | Admitting: Internal Medicine

## 2011-04-09 ENCOUNTER — Telehealth: Payer: Self-pay | Admitting: *Deleted

## 2011-04-09 ENCOUNTER — Ambulatory Visit (INDEPENDENT_AMBULATORY_CARE_PROVIDER_SITE_OTHER): Payer: BC Managed Care – PPO | Admitting: Internal Medicine

## 2011-04-09 DIAGNOSIS — M549 Dorsalgia, unspecified: Secondary | ICD-10-CM

## 2011-04-09 DIAGNOSIS — J988 Other specified respiratory disorders: Secondary | ICD-10-CM

## 2011-04-09 DIAGNOSIS — R05 Cough: Secondary | ICD-10-CM

## 2011-04-09 MED ORDER — ALBUTEROL SULFATE HFA 108 (90 BASE) MCG/ACT IN AERS: 2.0000 | INHALATION_SPRAY | Freq: Four times a day (QID) | RESPIRATORY_TRACT | Status: DC | PRN

## 2011-04-09 NOTE — Patient Instructions (Signed)
This could be flu and causing a bit  Of bronchospasm .   But get chest x ray to make sure there is not Pneumonia .   Will contact you about this . If x ray negative  Continue and fever should be gone in another 2 days  Cough can last for 2 weeks or so but should be getting better .  Call if needed    persistent or progressive

## 2011-04-09 NOTE — Telephone Encounter (Signed)
Pt is calling back for chest xray results that Dr. Fabian Sharp ordered today for pneumonia.  Please call her results.

## 2011-04-09 NOTE — Progress Notes (Signed)
  Subjective:    Patient ID: SAVANHA ISLAND, female    DOB: 1970/03/29, 41 y.o.   MRN: [id number]  HPI  Patient comes in today for SDA  For acute problem evaluation.  Fever and chills and cough and some doe and now  Hurts  In mid back thorax to cough. Felt like when had PNA  In the summer . No hemoptysis  Rashes   Unsure  No hx of asthma wheezing (doesnt get flu shot because of her personal concern about autoimmune in the past ( gluten) ) Review of Systems Neg NVD some sinus pressure popping off and on   No other changes hpi .  Has ht and med management   Past history family history social history reviewed in the electronic medical record.     Objective:   Physical Exam WDWN in nad congested and midly hoarse  Cough is wheezy in spells. Quiet respirations  [place] Ears tms clear slightly flushed and nl lm nares midl congestion no swelling,OP clear no edema or lesions Neck: Supple without adenopathy or masses or bruits Chest:  Clear to A&P  But   Tight  With forced expiratory sounds  norales or rhonchi  CV:  S1-S2 no gallops or murmurs peripheral perfusion is normal neuro non focal   After nebulizer increase air movement and looser cough and some dec  Cough    Last c xray  August showed lllopacity had z pack with itching and then rx with doxy     Assessment & Plan:  Flu like illness  Poss WARI   Hx of lll PNA with in the year .  Sx rx bronchodilators helpful    Expectant management. And get c xray   Today.     c xray showed no acute disease  reported to pt

## 2011-04-09 NOTE — Telephone Encounter (Signed)
CXR normal.

## 2011-04-09 NOTE — Telephone Encounter (Signed)
Pt called again to be sure someone calls her today, please.

## 2011-04-09 NOTE — Telephone Encounter (Signed)
See the results note that was reviewed earlier   .   Please call Her with the results!

## 2011-04-09 NOTE — Telephone Encounter (Signed)
Pt given results  

## 2011-04-16 MED ORDER — ALBUTEROL SULFATE (5 MG/ML) 0.5% IN NEBU
2.5000 mg | INHALATION_SOLUTION | Freq: Once | RESPIRATORY_TRACT | Status: AC
  Administered 2011-04-16: 2.5 mg via RESPIRATORY_TRACT

## 2011-04-16 NOTE — Progress Notes (Signed)
Addended by: Alfred Levins D on: 04/16/2011 01:57 PM   Modules accepted: Orders

## 2011-05-21 ENCOUNTER — Ambulatory Visit (INDEPENDENT_AMBULATORY_CARE_PROVIDER_SITE_OTHER): Payer: BC Managed Care – PPO | Admitting: Internal Medicine

## 2011-05-21 ENCOUNTER — Encounter: Payer: Self-pay | Admitting: *Deleted

## 2011-05-21 ENCOUNTER — Ambulatory Visit: Payer: BC Managed Care – PPO | Admitting: Internal Medicine

## 2011-05-21 VITALS — BP 120/80 | HR 66 | Wt 193.0 lb

## 2011-05-21 DIAGNOSIS — Z3042 Encounter for surveillance of injectable contraceptive: Secondary | ICD-10-CM

## 2011-05-21 DIAGNOSIS — Z3049 Encounter for surveillance of other contraceptives: Secondary | ICD-10-CM

## 2011-05-21 MED ORDER — MEDROXYPROGESTERONE ACETATE 150 MG/ML IM SUSP
150.0000 mg | Freq: Once | INTRAMUSCULAR | Status: AC
  Administered 2011-05-21: 150 mg via INTRAMUSCULAR

## 2011-05-21 NOTE — Progress Notes (Signed)
  Subjective:    Patient ID: Elizabeth Tanner, female    DOB: [birth year], 41 y.o.   MRN: [id number]  HPI  Pt came for depo shot  No sx o or concerns reported to CMA by protochol  Review of Systems     Objective:   Physical Exam        Assessment & Plan:

## 2011-05-21 NOTE — Patient Instructions (Addendum)
Next Depo Due 5/29 with CPX, verified SN

## 2011-06-29 ENCOUNTER — Ambulatory Visit (INDEPENDENT_AMBULATORY_CARE_PROVIDER_SITE_OTHER): Payer: BC Managed Care – PPO | Admitting: Internal Medicine

## 2011-06-29 ENCOUNTER — Encounter: Payer: Self-pay | Admitting: Internal Medicine

## 2011-06-29 VITALS — BP 112/74 | HR 118 | Temp 98.7°F | Wt 197.0 lb

## 2011-06-29 DIAGNOSIS — Z566 Other physical and mental strain related to work: Secondary | ICD-10-CM

## 2011-06-29 DIAGNOSIS — Z569 Unspecified problems related to employment: Secondary | ICD-10-CM

## 2011-06-29 DIAGNOSIS — I10 Essential (primary) hypertension: Secondary | ICD-10-CM

## 2011-06-29 MED ORDER — TRIAMTERENE-HCTZ 37.5-25 MG PO TABS: 1.0000 | ORAL_TABLET | Freq: Every day | ORAL | Status: DC

## 2011-06-29 NOTE — Patient Instructions (Signed)
Continue same  Med and will get lab at the may check up.

## 2011-06-29 NOTE — Progress Notes (Signed)
  Subjective:    Patient ID: Elizabeth Tanner, female    DOB: May 27, 1970, 41 y.o.   MRN: [id number]  HPI Pt comesin today for bp fu .   Now on whole pill per day.  Diuretic    bp readings much better  Taking meds  And if misses meds can tell a difference.  Elevation and  palpiations   Review of Systems No fever cp sob  Pulse tends to run high  Past history family history social history reviewed in the electronic medical record.  Outpatient Prescriptions Prior to Visit  Medication Sig Dispense Refill  . DULoxetine (CYMBALTA) 60 MG capsule Take 60 mg by mouth daily. 2 daily       . hydrOXYzine (VISTARIL) 25 MG capsule Take 25 mg by mouth 3 (three) times daily as needed.        . lamoTRIgine (LAMICTAL) 100 MG tablet Take 100 mg by mouth 2 (two) times daily.        . medroxyPROGESTERone (DEPO-PROVERA) 150 MG/ML injection Inject 150 mg into the muscle every 3 (three) months.        . triamterene-hydrochlorothiazide (MAXZIDE-25) 37.5-25 MG per tablet Take 1 each (1 tablet total) by mouth daily.  90 tablet  1  . albuterol (PROVENTIL HFA) 108 (90 BASE) MCG/ACT inhaler Inhale 2 puffs into the lungs every 6 (six) hours as needed for wheezing or shortness of breath.  1 Inhaler  1       Objective:   Physical Exam BP 112/74  Pulse 118  Temp(Src) 98.7 F (37.1 C) (Oral)  Wt 197 lb (89.359 kg)  SpO2 96% Repeat bp right  118/80  Large cuff  Chest:  Clear to A&P without wheezes rales or rhonchi CV:  S1-S2 no gallops or murmurs peripheral perfusion is normal Pulse  100 regular       Assessment & Plan:   HT    much better refill med for now  stress coping   Due for full labs in May anyway so will wait on chem check till may  cpx labs   Continue meds at this time

## 2011-08-06 ENCOUNTER — Other Ambulatory Visit (INDEPENDENT_AMBULATORY_CARE_PROVIDER_SITE_OTHER): Payer: BC Managed Care – PPO

## 2011-08-06 DIAGNOSIS — Z0000 Encounter for general adult medical examination without abnormal findings: Secondary | ICD-10-CM

## 2011-08-06 DIAGNOSIS — E785 Hyperlipidemia, unspecified: Secondary | ICD-10-CM

## 2011-08-06 LAB — CBC WITH DIFFERENTIAL/PLATELET
Basophils Absolute: 0 10*3/uL (ref 0.0–0.1)
Eosinophils Relative: 1.3 % (ref 0.0–5.0)
HCT: 43.9 % (ref 36.0–46.0)
Hemoglobin: 14.8 g/dL (ref 12.0–15.0)
Lymphs Abs: 2.2 10*3/uL (ref 0.7–4.0)
MCV: 93.8 fl (ref 78.0–100.0)
Monocytes Absolute: 0.5 10*3/uL (ref 0.1–1.0)
Monocytes Relative: 5.7 % (ref 3.0–12.0)
Neutro Abs: 6.6 10*3/uL (ref 1.4–7.7)
RDW: 13.3 % (ref 11.5–14.6)

## 2011-08-06 LAB — POCT URINALYSIS DIPSTICK
Leukocytes, UA: NEGATIVE
Nitrite, UA: NEGATIVE
Protein, UA: NEGATIVE
Urobilinogen, UA: 0.2

## 2011-08-06 LAB — BASIC METABOLIC PANEL
Chloride: 102 mEq/L (ref 96–112)
GFR: 80.31 mL/min (ref >60.00)
Glucose, Bld: 96 mg/dL (ref 70–99)
Potassium: 3.8 mEq/L (ref 3.5–5.1)
Sodium: 137 mEq/L (ref 135–145)

## 2011-08-06 LAB — LIPID PANEL
HDL: 45.2 mg/dL (ref >39.00)
VLDL: 55.8 mg/dL — ABNORMAL HIGH (ref 0.0–40.0)

## 2011-08-06 LAB — HEPATIC FUNCTION PANEL
ALT: 36 U/L — ABNORMAL HIGH (ref 0–35)
AST: 25 U/L (ref 0–37)
Albumin: 4.3 g/dL (ref 3.5–5.2)

## 2011-08-06 LAB — LDL CHOLESTEROL, DIRECT: Direct LDL: 151.3 mg/dL

## 2011-08-12 ENCOUNTER — Encounter: Payer: Self-pay | Admitting: Internal Medicine

## 2011-08-12 ENCOUNTER — Ambulatory Visit (INDEPENDENT_AMBULATORY_CARE_PROVIDER_SITE_OTHER): Payer: BC Managed Care – PPO | Admitting: Internal Medicine

## 2011-08-12 VITALS — BP 108/70 | HR 134 | Temp 99.5°F | Ht 59.0 in | Wt 194.0 lb

## 2011-08-12 DIAGNOSIS — E669 Obesity, unspecified: Secondary | ICD-10-CM

## 2011-08-12 DIAGNOSIS — R945 Abnormal results of liver function studies: Secondary | ICD-10-CM

## 2011-08-12 DIAGNOSIS — F329 Major depressive disorder, single episode, unspecified: Secondary | ICD-10-CM

## 2011-08-12 DIAGNOSIS — E785 Hyperlipidemia, unspecified: Secondary | ICD-10-CM

## 2011-08-12 DIAGNOSIS — Z3049 Encounter for surveillance of other contraceptives: Secondary | ICD-10-CM

## 2011-08-12 DIAGNOSIS — N946 Dysmenorrhea, unspecified: Secondary | ICD-10-CM

## 2011-08-12 DIAGNOSIS — M542 Cervicalgia: Secondary | ICD-10-CM

## 2011-08-12 DIAGNOSIS — I10 Essential (primary) hypertension: Secondary | ICD-10-CM

## 2011-08-12 DIAGNOSIS — R7989 Other specified abnormal findings of blood chemistry: Secondary | ICD-10-CM

## 2011-08-12 DIAGNOSIS — F3289 Other specified depressive episodes: Secondary | ICD-10-CM

## 2011-08-12 DIAGNOSIS — Z0000 Encounter for general adult medical examination without abnormal findings: Secondary | ICD-10-CM

## 2011-08-12 DIAGNOSIS — Z3042 Encounter for surveillance of injectable contraceptive: Secondary | ICD-10-CM

## 2011-08-12 MED ORDER — CYCLOBENZAPRINE HCL 10 MG PO TABS: 10.0000 mg | ORAL_TABLET | Freq: Three times a day (TID) | ORAL | Status: AC | PRN

## 2011-08-12 NOTE — Patient Instructions (Signed)
Decrease simple carbs and animal fats increase exercise . This will help triglycerides. Attend to back hygiene and position with work. Muscle relaxant as needed .  Can be drowsy producing at times. Can do pap every 3 years . Gentle upper body exercises can help spasm.

## 2011-08-12 NOTE — Progress Notes (Signed)
Subjective:    Patient ID: Elizabeth Tanner, female    DOB: 02-20-1971, 41 y.o.   MRN: [id number]  HPI  Patient comes in today for preventive visit and follow-up of medical issues. Update  history since  last visit: No major injury or med change.  Her neck arm pain has flared up again . On depo want to stay on this. lasat pap a year ago Losing weight problematic . Stress at work  still  BP stable  Past history family history social history reviewed in the electronic medical record.   Review of Systems ROS:  GEN/ HEENT: No fever, significant weight changes sweats headaches vision problems hearing changes, eyes feellbulging at times.  CV/ PULM; No chest pain shortness of breath cough, syncope,edema  change in exercise tolerance. GI /GU: No adominal pain, vomiting, change in bowel habits. No blood in the stool. No significant GU symptoms. SKIN/HEME: ,no acute skin rashes suspicious lesions or bleeding. No lymphadenopathy, nodules, masses.  NEURO/ PSYCH:  No neurologic signs such as weakness numbness. No depression anxiety. IMM/ Allergy: No unusual infections.  Allergy .   REST of 12 system review negative except as per HPI     Objective:   Physical Exam  BP 108/70  Pulse 134  Temp(Src) 99.5 F (37.5 C) (Oral)  Ht 4\' 11"  (1.499 m)  Wt 194 lb (87.998 kg)  BMI 39.18 kg/m2  SpO2 96% Physical Exam: Vital signs reviewed WUJ:WJXB is a well-developed well-nourished alert cooperative  white female who appears her stated age in no acute distress.  HEENT: normocephalic atraumatic , Eyes: PERRL EOM's full, conjunctiva clear, Nares: paten,t no deformity discharge or tenderness., Ears: no deformity EAC's clear TMs with normal landmarks. Mouth: clear OP, no lesions, edema.  Moist mucous membranes. Dentition in adequate repair. NECK: supple without masses,  or bruits. CHEST/PULM:  Clear to auscultation and percussion breath sounds equal no wheeze , rales or rhonchi. No chest wall deformities or  tenderness. CV: PMI is nondisplaced, S1 S2 no gallops, murmurs, rubs. Peripheral pulses are full without delay.No JVD .  Breast: normal by inspection . No dimpling, discharge, masses, tenderness or discharge . ABDOMEN: Bowel sounds normal nontender  No guard or rebound, no hepato splenomegal no CVA tenderness.  No hernia. Extremtities:  No clubbing cyanosis or edema, no acute joint swelling or redness no focal atrophy NEURO:  Oriented x3, cranial nerves 3-12 appear to be intact, no obvious focal weakness,gait within normal limits no abnormal reflexes or asymmetrical SKIN: No acute rashes normal turgor, color, no bruising or petechiae. No stria PSYCH: Oriented, good eye contact, no obvious depression anxiety, cognition and judgment appear normal.somewhat hyperactive in motor  No tremors  LN: no cervical axillary inguinal adenopathy    Lab Results  Component Value Date   WBC 9.5 08/06/2011   HGB 14.8 08/06/2011   HCT 43.9 08/06/2011   PLT 301.0 08/06/2011   GLUCOSE 96 08/06/2011   CHOL 215* 08/06/2011   TRIG 279.0* 08/06/2011   HDL 45.20 08/06/2011   LDLDIRECT 151.3 08/06/2011   LDLCALC 95 07/17/2008   ALT 36* 08/06/2011   AST 25 08/06/2011   NA 137 08/06/2011   K 3.8 08/06/2011   CL 102 08/06/2011   CREATININE 0.8 08/06/2011   BUN 8 08/06/2011   CO2 24 08/06/2011   TSH 2.26 08/06/2011   HGBA1C 5.4 12/23/2010          Assessment & Plan:  Preventive Health Care Counseled regarding healthy nutrition, exercise, sleep, injury  prevention, calcium vit d and healthy weight .  Hypertension  ;   Stable Minor liver abnormality . Will follow    On depo  Continue  Per pt   Neck upper back pain  Requests some flexeril to use if needed as has helped intthe past .  Thyroid stable at this time. Obesity weight  Counseled. Pt aware

## 2011-08-16 ENCOUNTER — Encounter: Payer: Self-pay | Admitting: Internal Medicine

## 2011-08-16 DIAGNOSIS — R945 Abnormal results of liver function studies: Secondary | ICD-10-CM | POA: Insufficient documentation

## 2011-08-16 DIAGNOSIS — Z3042 Encounter for surveillance of injectable contraceptive: Secondary | ICD-10-CM | POA: Insufficient documentation

## 2011-08-18 ENCOUNTER — Other Ambulatory Visit (INDEPENDENT_AMBULATORY_CARE_PROVIDER_SITE_OTHER): Payer: BC Managed Care – PPO | Admitting: Internal Medicine

## 2011-08-18 DIAGNOSIS — Z3049 Encounter for surveillance of other contraceptives: Secondary | ICD-10-CM

## 2011-08-18 DIAGNOSIS — Z3042 Encounter for surveillance of injectable contraceptive: Secondary | ICD-10-CM

## 2011-08-18 MED ORDER — MEDROXYPROGESTERONE ACETATE 150 MG/ML IM SUSP: 150.0000 mg | Freq: Once | INTRAMUSCULAR | Status: DC

## 2011-08-18 MED ORDER — MEDROXYPROGESTERONE ACETATE 150 MG/ML IM SUSP
150.0000 mg | Freq: Once | INTRAMUSCULAR | Status: AC
  Administered 2011-08-18: 150 mg via INTRAMUSCULAR

## 2011-08-18 NOTE — Progress Notes (Signed)
Addended by: Earle Gell C on: 08/18/2011 11:00 AM   Modules accepted: Orders

## 2011-11-03 ENCOUNTER — Ambulatory Visit (INDEPENDENT_AMBULATORY_CARE_PROVIDER_SITE_OTHER): Payer: BC Managed Care – PPO

## 2011-11-03 DIAGNOSIS — Z309 Encounter for contraceptive management, unspecified: Secondary | ICD-10-CM

## 2011-11-03 MED ORDER — MEDROXYPROGESTERONE ACETATE 150 MG/ML IM SUSP
150.0000 mg | Freq: Once | INTRAMUSCULAR | Status: AC
  Administered 2011-11-03: 150 mg via INTRAMUSCULAR

## 2012-01-25 ENCOUNTER — Ambulatory Visit: Payer: BC Managed Care – PPO

## 2012-01-25 ENCOUNTER — Ambulatory Visit (INDEPENDENT_AMBULATORY_CARE_PROVIDER_SITE_OTHER): Payer: BC Managed Care – PPO | Admitting: Family Medicine

## 2012-01-25 DIAGNOSIS — Z309 Encounter for contraceptive management, unspecified: Secondary | ICD-10-CM

## 2012-01-25 MED ORDER — MEDROXYPROGESTERONE ACETATE 150 MG/ML IM SUSP
150.0000 mg | Freq: Once | INTRAMUSCULAR | Status: AC
  Administered 2012-01-25: 150 mg via INTRAMUSCULAR

## 2012-02-16 ENCOUNTER — Other Ambulatory Visit: Payer: Self-pay | Admitting: Internal Medicine

## 2012-04-18 ENCOUNTER — Ambulatory Visit (INDEPENDENT_AMBULATORY_CARE_PROVIDER_SITE_OTHER): Payer: BC Managed Care – PPO | Admitting: Family Medicine

## 2012-04-18 DIAGNOSIS — Z309 Encounter for contraceptive management, unspecified: Secondary | ICD-10-CM

## 2012-04-18 MED ORDER — MEDROXYPROGESTERONE ACETATE 150 MG/ML IM SUSP
150.0000 mg | Freq: Once | INTRAMUSCULAR | Status: AC
  Administered 2012-04-18: 150 mg via INTRAMUSCULAR

## 2012-04-27 ENCOUNTER — Emergency Department (HOSPITAL_COMMUNITY): Payer: BC Managed Care – PPO

## 2012-04-27 ENCOUNTER — Emergency Department (HOSPITAL_COMMUNITY)
Admission: EM | Admit: 2012-04-27 | Discharge: 2012-04-27 | Disposition: A | Discharge status: Home / Self Care | Payer: BC Managed Care – PPO | Attending: Emergency Medicine | Admitting: Emergency Medicine

## 2012-04-27 DIAGNOSIS — S82109A Unspecified fracture of upper end of unspecified tibia, initial encounter for closed fracture: Secondary | ICD-10-CM | POA: Insufficient documentation

## 2012-04-27 DIAGNOSIS — Z8742 Personal history of other diseases of the female genital tract: Secondary | ICD-10-CM | POA: Insufficient documentation

## 2012-04-27 DIAGNOSIS — F329 Major depressive disorder, single episode, unspecified: Secondary | ICD-10-CM | POA: Insufficient documentation

## 2012-04-27 DIAGNOSIS — Z79899 Other long term (current) drug therapy: Secondary | ICD-10-CM | POA: Insufficient documentation

## 2012-04-27 DIAGNOSIS — Z8719 Personal history of other diseases of the digestive system: Secondary | ICD-10-CM | POA: Insufficient documentation

## 2012-04-27 DIAGNOSIS — S82143A Displaced bicondylar fracture of unspecified tibia, initial encounter for closed fracture: Secondary | ICD-10-CM

## 2012-04-27 DIAGNOSIS — W010XXA Fall on same level from slipping, tripping and stumbling without subsequent striking against object, initial encounter: Secondary | ICD-10-CM | POA: Insufficient documentation

## 2012-04-27 DIAGNOSIS — Y929 Unspecified place or not applicable: Secondary | ICD-10-CM | POA: Insufficient documentation

## 2012-04-27 DIAGNOSIS — F3289 Other specified depressive episodes: Secondary | ICD-10-CM | POA: Insufficient documentation

## 2012-04-27 DIAGNOSIS — Z87891 Personal history of nicotine dependence: Secondary | ICD-10-CM | POA: Insufficient documentation

## 2012-04-27 DIAGNOSIS — Z8781 Personal history of (healed) traumatic fracture: Secondary | ICD-10-CM | POA: Insufficient documentation

## 2012-04-27 DIAGNOSIS — Y9301 Activity, walking, marching and hiking: Secondary | ICD-10-CM | POA: Insufficient documentation

## 2012-04-27 DIAGNOSIS — Z8701 Personal history of pneumonia (recurrent): Secondary | ICD-10-CM | POA: Insufficient documentation

## 2012-04-27 HISTORY — DX: Unspecified injury of unspecified lower leg, initial encounter: S89.90XA

## 2012-04-27 MED ORDER — OXYCODONE-ACETAMINOPHEN 5-325 MG PO TABS: 2.0000 | ORAL_TABLET | Freq: Four times a day (QID) | ORAL | Status: DC | PRN

## 2012-04-27 MED ORDER — OXYCODONE-ACETAMINOPHEN 5-325 MG PO TABS
2.0000 | ORAL_TABLET | Freq: Once | ORAL | Status: AC
  Administered 2012-04-27: 2 via ORAL
  Filled 2012-04-27: qty 2

## 2012-04-27 NOTE — ED Notes (Signed)
Pt slipped coming in from outside on the linoleum and hit R knee. States she looked it up on Web MD and thinks she tore her ACL.

## 2012-04-27 NOTE — ED Provider Notes (Signed)
Medical screening examination/treatment/procedure(s) were performed by non-physician practitioner and as supervising physician I was immediately available for consultation/collaboration.   Josia Cueva L Jaykwon Morones, MD 04/27/12 1957 

## 2012-04-27 NOTE — ED Provider Notes (Signed)
History     CSN: [id number]  Arrival date & time 04/27/12  1644   First MD Initiated Contact with Patient 04/27/12 1652      Chief Complaint  Patient presents with  . Fall  . Knee Pain    (Consider location/radiation/quality/duration/timing/severity/associated sxs/prior treatment) HPI Comments: This is a 42 year old female, who presents emergency department with chief complaint of right knee pain. Patient states that she was walking, and slipped due to this now. She reports that her right knee twisted "funny", and that now she cannot weight-bear on her right leg. She has not tried anything to alleviate her symptoms. The pain does not radiate. Is worsened with movement and with weightbearing activity. She states that her pain is 9/10. She denies other symptoms, denies chest pain, shortness of breath, nausea, vomiting, diarrhea, constipation, numbness and tingling of the extremities per  The history is provided by the patient. No language interpreter was used.    Past Medical History  Diagnosis Date  . Depression     ? atypical  . Dysmenorrhea   . Gluten intolerance     self reported  . Fracture of fibula     rt  . History of tobacco use   . Pneumonia 8 12     LLL     Past Surgical History  Procedure Laterality Date  . Breast reduction surgery  2001    Family History  Problem Relation Age of Onset  . Alcohol abuse    . Thyroid disease    . Arthritis      Father has a fused spine uncle with DJD  . Heart attack Maternal Uncle 30    Electrical problem also maternal aunt  . Melanoma Cousin   . Other Maternal Aunt     68 pacer.     History  Substance Use Topics  . Smoking status: Former Smoker -- 1.50 packs/day for 8 years    Types: Cigarettes  . Smokeless tobacco: Never Used  . Alcohol Use: Yes    OB History   Grav Para Term Preterm Abortions TAB SAB Ect Mult Living                  Review of Systems  All other systems reviewed and are  negative.    Allergies  Azithromycin; Amoxicillin; Gluten; and Penicillins  Home Medications   Current Outpatient Rx  Name  Route  Sig  Dispense  Refill  . EXPIRED: albuterol (PROVENTIL HFA) 108 (90 BASE) MCG/ACT inhaler   Inhalation   Inhale 2 puffs into the lungs every 6 (six) hours as needed for wheezing or shortness of breath.   1 Inhaler   1   . DULoxetine (CYMBALTA) 60 MG capsule   Oral   Take 60 mg by mouth daily. 2 daily          . hydrOXYzine (VISTARIL) 25 MG capsule   Oral   Take 25 mg by mouth 3 (three) times daily as needed.           . lamoTRIgine (LAMICTAL) 100 MG tablet   Oral   Take 100 mg by mouth 2 (two) times daily.           . medroxyPROGESTERone (DEPO-PROVERA) 150 MG/ML injection   Intramuscular   Inject 150 mg into the muscle every 3 (three) months.           . triamterene-hydrochlorothiazide (MAXZIDE-25) 37.5-25 MG per tablet      TAKE ONE TABLET BY MOUTH  ONE TIME DAILY   90 tablet   1     BP 121/80  Pulse 123  Temp(Src) 98.6 F (37 C) (Oral)  SpO2 100%  Physical Exam  Nursing note and vitals reviewed. Constitutional: She is oriented to person, place, and time. She appears well-developed and well-nourished.  HENT:  Head: Normocephalic and atraumatic.  Eyes: Conjunctivae and EOM are normal.  Neck: Normal range of motion.  Cardiovascular: Normal rate.   Pulmonary/Chest: Effort normal.  Abdominal: She exhibits no distension.  Musculoskeletal: Normal range of motion. She exhibits tenderness.  Mildly swollen right knee, tender to palpation over the medial and lateral joint lines, patella apprehension test is negative, unable to elicit a firm end feel with anterior cruciate ligament and MCL testing, however the patient was guarding during the test  Neurological: She is alert and oriented to person, place, and time.  Skin: Skin is dry.  Psychiatric: She has a normal mood and affect. Her behavior is normal. Judgment and thought  content normal.    ED Course  Procedures (including critical care time)  Ct Knee Right Wo Contrast  04/27/2012  *RADIOLOGY REPORT*  Clinical Data: Fall.  Knee injury.  CT OF THE RIGHT KNEE WITHOUT CONTRAST  Technique:  Multidetector CT imaging was performed according to the standard protocol. Multiplanar CT image reconstructions were also generated.  Comparison: 04/27/2012  Findings: Moderately comminuted lateral tibial plateau fracture observed with a 2.6 by 2.2 cm region of impaction.  There is up to 7 mm of impaction, with the sclerotic impacted bony fragments shown on image 41 of series 5, with mild fragmentation.  There is also an associated longitudinal fracture of the lateral tibial plateau extending down to the level of the tibial tubercle, with the posterior margin extending into the proximal tibiofibular articulation.  Lipohemarthrosis noted.  No additional fractures observed.  IMPRESSION:  1.  Lateral tibial plateau fracture, with a 2.6 x 2.2 cm region impacted about 7 mm, and with a sagittally oriented to longitudinal fracture component extending into the proximal tibiofibular articulation, and is far down as the tibial tubercle. 2.  Lipohemarthrosis.   Original Report Authenticated By: Gaylyn Rong, M.D.    Dg Knee Complete 4 Views Right  04/27/2012  *RADIOLOGY REPORT*  Clinical Data: Post fall, now with right knee pain  RIGHT KNEE - COMPLETE 4+ VIEW  Comparison: None.  Findings: There is a comminuted, slightly impacted fracture of the lateral tibial plateau with likely disruption of the proximal tib- fib joint.  This finding is associated with expected moderate sized joint effusion.  Expected adjacent soft tissue swelling.  No radiopaque foreign body.  IMPRESSION: Comminuted, slightly impacted fracture of the lateral tibial plateau.   Original Report Authenticated By: Tacey Ruiz, MD       1. Tibial plateau fracture       MDM  42 year old female with right knee pain. Will  order a right knee plain film, to evaluate for fracture, the patient's knee felt loose on exam, and I am concerned about the internal structures of the knee possibly being damaged.  6:28 PM Knee x-ray shows tibial plateau fracture. I spoke with Dr. Victorino Dike from orthopedics, who tells me that the patient will need to have a CT scan of her right knee. He tells me that she may be discharged with a Jones dressing and knee immobilizer and crutches. She is to followup with him in his office on Friday. I will give her pain medicine and followup instructions. Patient  understands and agrees with the plan. She says that she would prefer to go home, and that she feels safe going home.        Roxy Horseman, PA-C 04/27/12 1907

## 2012-04-27 NOTE — ED Notes (Signed)
[id number]<AV> Expected date:<BR> Expected time:<BR> Means of arrival:<BR> Comments:<BR> Ems/ knee pain from a fall on snow

## 2012-04-28 ENCOUNTER — Encounter (HOSPITAL_COMMUNITY): Payer: Self-pay | Admitting: Internal Medicine

## 2012-05-02 ENCOUNTER — Encounter (HOSPITAL_BASED_OUTPATIENT_CLINIC_OR_DEPARTMENT_OTHER): Payer: Self-pay | Admitting: *Deleted

## 2012-05-02 NOTE — Progress Notes (Signed)
Will try to get ride in for ekg-bmet-if not come in 2 hr early dos Denies any cardiac or resp problems-had pneumonia 2 yr ago Denies snoring or sleep apnea

## 2012-05-04 ENCOUNTER — Other Ambulatory Visit: Payer: Self-pay | Admitting: Orthopedic Surgery

## 2012-05-05 ENCOUNTER — Encounter (HOSPITAL_BASED_OUTPATIENT_CLINIC_OR_DEPARTMENT_OTHER)
Admission: RE | Disposition: A | Discharge status: Home / Self Care | Payer: Self-pay | Source: Ambulatory Visit | Attending: Orthopedic Surgery

## 2012-05-05 ENCOUNTER — Ambulatory Visit (HOSPITAL_COMMUNITY): Payer: BC Managed Care – PPO

## 2012-05-05 ENCOUNTER — Encounter (HOSPITAL_BASED_OUTPATIENT_CLINIC_OR_DEPARTMENT_OTHER): Payer: Self-pay | Admitting: *Deleted

## 2012-05-05 ENCOUNTER — Encounter (HOSPITAL_BASED_OUTPATIENT_CLINIC_OR_DEPARTMENT_OTHER): Payer: Self-pay

## 2012-05-05 ENCOUNTER — Ambulatory Visit (HOSPITAL_BASED_OUTPATIENT_CLINIC_OR_DEPARTMENT_OTHER)
Admission: RE | Admit: 2012-05-05 | Discharge: 2012-05-06 | Disposition: A | Discharge status: Home / Self Care | Payer: BC Managed Care – PPO | Source: Ambulatory Visit | Attending: Orthopedic Surgery | Admitting: Orthopedic Surgery

## 2012-05-05 ENCOUNTER — Ambulatory Visit (HOSPITAL_BASED_OUTPATIENT_CLINIC_OR_DEPARTMENT_OTHER): Payer: BC Managed Care – PPO | Admitting: *Deleted

## 2012-05-05 DIAGNOSIS — S82109A Unspecified fracture of upper end of unspecified tibia, initial encounter for closed fracture: Secondary | ICD-10-CM | POA: Insufficient documentation

## 2012-05-05 DIAGNOSIS — I10 Essential (primary) hypertension: Secondary | ICD-10-CM | POA: Insufficient documentation

## 2012-05-05 DIAGNOSIS — W19XXXA Unspecified fall, initial encounter: Secondary | ICD-10-CM | POA: Insufficient documentation

## 2012-05-05 HISTORY — DX: Presence of spectacles and contact lenses: Z97.3

## 2012-05-05 HISTORY — DX: Essential (primary) hypertension: I10

## 2012-05-05 HISTORY — PX: ORIF TIBIA PLATEAU: SHX2132

## 2012-05-05 LAB — POCT I-STAT, CHEM 8
Creatinine, Ser: 0.8 mg/dL (ref 0.50–1.10)
Glucose, Bld: 115 mg/dL — ABNORMAL HIGH (ref 70–99)
HCT: 42 % (ref 36.0–46.0)
Hemoglobin: 14.3 g/dL (ref 12.0–15.0)
Potassium: 3.6 mEq/L (ref 3.5–5.1)
TCO2: 26 mmol/L (ref 0–100)

## 2012-05-05 SURGERY — OPEN REDUCTION INTERNAL FIXATION (ORIF) TIBIAL PLATEAU
Anesthesia: General | Site: Knee | Laterality: Right | Wound class: Clean

## 2012-05-05 MED ORDER — FENTANYL CITRATE 0.05 MG/ML IJ SOLN: 50.0000 ug | INTRAMUSCULAR | Status: DC | PRN

## 2012-05-05 MED ORDER — FENTANYL CITRATE 0.05 MG/ML IJ SOLN
INTRAMUSCULAR | Status: DC | PRN
  Administered 2012-05-05: 25 ug via INTRAVENOUS
  Administered 2012-05-05 (×3): 50 ug via INTRAVENOUS
  Administered 2012-05-05: 100 ug via INTRAVENOUS
  Administered 2012-05-05: 50 ug via INTRAVENOUS
  Administered 2012-05-05 (×3): 25 ug via INTRAVENOUS
  Administered 2012-05-05: 50 ug via INTRAVENOUS

## 2012-05-05 MED ORDER — LIDOCAINE HCL (CARDIAC) 20 MG/ML IV SOLN
INTRAVENOUS | Status: DC | PRN
  Administered 2012-05-05 (×2): 50 mg via INTRAVENOUS

## 2012-05-05 MED ORDER — ONDANSETRON HCL 4 MG/2ML IJ SOLN: 4.0000 mg | Freq: Four times a day (QID) | INTRAMUSCULAR | Status: DC | PRN

## 2012-05-05 MED ORDER — CEFAZOLIN SODIUM-DEXTROSE 2-3 GM-% IV SOLR
2.0000 g | INTRAVENOUS | Status: AC
  Administered 2012-05-05: 2 g via INTRAVENOUS

## 2012-05-05 MED ORDER — PROMETHAZINE HCL 25 MG/ML IJ SOLN: 6.2500 mg | INTRAMUSCULAR | Status: DC | PRN

## 2012-05-05 MED ORDER — LACTATED RINGERS IV SOLN
INTRAVENOUS | Status: DC
  Administered 2012-05-05 (×3): via INTRAVENOUS

## 2012-05-05 MED ORDER — SENNA 8.6 MG PO TABS: 2.0000 | ORAL_TABLET | Freq: Two times a day (BID) | ORAL | Status: DC

## 2012-05-05 MED ORDER — BACITRACIN ZINC 500 UNIT/GM EX OINT
TOPICAL_OINTMENT | CUTANEOUS | Status: DC | PRN
  Administered 2012-05-05: 1 via TOPICAL

## 2012-05-05 MED ORDER — OXYCODONE-ACETAMINOPHEN 5-325 MG PO TABS
1.0000 | ORAL_TABLET | ORAL | Status: DC | PRN
  Administered 2012-05-05 – 2012-05-06 (×4): 2 via ORAL

## 2012-05-05 MED ORDER — LABETALOL HCL 5 MG/ML IV SOLN
INTRAVENOUS | Status: DC | PRN
  Administered 2012-05-05 (×2): 2.5 mg via INTRAVENOUS

## 2012-05-05 MED ORDER — MIDAZOLAM HCL 5 MG/5ML IJ SOLN
INTRAMUSCULAR | Status: DC | PRN
  Administered 2012-05-05: 2 mg via INTRAVENOUS

## 2012-05-05 MED ORDER — MIDAZOLAM HCL 2 MG/2ML IJ SOLN: 1.0000 mg | INTRAMUSCULAR | Status: DC | PRN

## 2012-05-05 MED ORDER — OXYCODONE HCL 5 MG PO TABS
5.0000 mg | ORAL_TABLET | ORAL | Status: DC | PRN
Start: 2012-05-05 — End: 2012-08-29

## 2012-05-05 MED ORDER — PROPOFOL 10 MG/ML IV BOLUS
INTRAVENOUS | Status: DC | PRN
  Administered 2012-05-05: 200 mg via INTRAVENOUS

## 2012-05-05 MED ORDER — CHLORHEXIDINE GLUCONATE 4 % EX LIQD: 60.0000 mL | Freq: Once | CUTANEOUS | Status: DC

## 2012-05-05 MED ORDER — HYDROMORPHONE HCL PF 1 MG/ML IJ SOLN
0.2500 mg | INTRAMUSCULAR | Status: DC | PRN
  Administered 2012-05-05: 0.25 mg via INTRAVENOUS
  Administered 2012-05-05: 0.5 mg via INTRAVENOUS
  Administered 2012-05-05: 0.25 mg via INTRAVENOUS

## 2012-05-05 MED ORDER — ONDANSETRON HCL 4 MG PO TABS: 4.0000 mg | ORAL_TABLET | Freq: Four times a day (QID) | ORAL | Status: DC | PRN

## 2012-05-05 MED ORDER — ONDANSETRON HCL 4 MG/2ML IJ SOLN
INTRAMUSCULAR | Status: DC | PRN
  Administered 2012-05-05: 4 mg via INTRAVENOUS

## 2012-05-05 MED ORDER — ENOXAPARIN SODIUM 40 MG/0.4ML ~~LOC~~ SOLN
40.0000 mg | SUBCUTANEOUS | Status: DC
  Administered 2012-05-06: 40 mg via SUBCUTANEOUS

## 2012-05-05 MED ORDER — SODIUM CHLORIDE 0.9 % IV SOLN
INTRAVENOUS | Status: DC
  Administered 2012-05-05: 14:00:00 via INTRAVENOUS

## 2012-05-05 MED ORDER — DOCUSATE SODIUM 100 MG PO CAPS: 100.0000 mg | ORAL_CAPSULE | Freq: Two times a day (BID) | ORAL | Status: DC

## 2012-05-05 MED ORDER — RIVAROXABAN 10 MG PO TABS: 10.0000 mg | ORAL_TABLET | Freq: Every day | ORAL | Status: DC

## 2012-05-05 MED ORDER — 0.9 % SODIUM CHLORIDE (POUR BTL) OPTIME
TOPICAL | Status: DC | PRN
  Administered 2012-05-05: 700 mL

## 2012-05-05 MED ORDER — LAMOTRIGINE 100 MG PO TABS: 100.0000 mg | ORAL_TABLET | Freq: Two times a day (BID) | ORAL | Status: DC

## 2012-05-05 MED ORDER — BUPIVACAINE HCL (PF) 0.5 % IJ SOLN
INTRAMUSCULAR | Status: DC | PRN
  Administered 2012-05-05: 30 mL

## 2012-05-05 MED ORDER — DEXAMETHASONE SODIUM PHOSPHATE 10 MG/ML IJ SOLN
INTRAMUSCULAR | Status: DC | PRN
  Administered 2012-05-05: 10 mg via INTRAVENOUS

## 2012-05-05 MED ORDER — ACETAMINOPHEN 10 MG/ML IV SOLN
INTRAVENOUS | Status: DC | PRN
  Administered 2012-05-05: 1000 mg via INTRAVENOUS

## 2012-05-05 MED ORDER — HYDROMORPHONE HCL PF 1 MG/ML IJ SOLN
0.5000 mg | INTRAMUSCULAR | Status: DC | PRN
Start: 2012-05-05 — End: 2012-05-06
  Administered 2012-05-05 – 2012-05-06 (×6): 0.5 mg via INTRAVENOUS

## 2012-05-05 MED ORDER — TRIAMTERENE-HCTZ 37.5-25 MG PO TABS: 1.0000 | ORAL_TABLET | Freq: Every day | ORAL | Status: DC

## 2012-05-05 MED ORDER — DULOXETINE HCL 60 MG PO CPEP: 120.0000 mg | ORAL_CAPSULE | Freq: Every day | ORAL | Status: DC

## 2012-05-05 SURGICAL SUPPLY — 78 items
3.5mm MD screw x 75 ×2 IMPLANT
BANDAGE ELASTIC 6 VELCRO ST LF (GAUZE/BANDAGES/DRESSINGS) ×4 IMPLANT
BANDAGE ESMARK 6X9 LF (GAUZE/BANDAGES/DRESSINGS) ×1 IMPLANT
BIT DRILL 100X2.5XANTM LCK (BIT) ×1 IMPLANT
BIT DRILL CAL (BIT) ×1 IMPLANT
BIT DRL 100X2.5XANTM LCK (BIT) ×1
BLADE SURG 15 STRL LF DISP TIS (BLADE) ×2 IMPLANT
BLADE SURG 15 STRL SS (BLADE) ×2
BNDG COHESIVE 4X5 TAN STRL (GAUZE/BANDAGES/DRESSINGS) ×2 IMPLANT
BNDG COHESIVE 6X5 TAN STRL LF (GAUZE/BANDAGES/DRESSINGS) ×2 IMPLANT
BNDG ESMARK 6X9 LF (GAUZE/BANDAGES/DRESSINGS) ×2
BONE CHIP PRESERV 20CC (Bone Implant) ×2 IMPLANT
CHLORAPREP W/TINT 26ML (MISCELLANEOUS) ×2 IMPLANT
COVER TABLE BACK 60X90 (DRAPES) ×2 IMPLANT
CUFF TOURNIQUET SINGLE 34IN LL (TOURNIQUET CUFF) ×2 IMPLANT
DECANTER SPIKE VIAL GLASS SM (MISCELLANEOUS) IMPLANT
DRAPE C-ARM 42X72 X-RAY (DRAPES) ×2 IMPLANT
DRAPE C-ARMOR (DRAPES) ×2 IMPLANT
DRAPE EXTREMITY T 121X128X90 (DRAPE) ×2 IMPLANT
DRAPE U-SHAPE 47X51 STRL (DRAPES) ×2 IMPLANT
DRILL BIT 2.5MM (BIT) ×1
DRILL BIT CAL (BIT) ×2
DRSG EMULSION OIL 3X3 NADH (GAUZE/BANDAGES/DRESSINGS) IMPLANT
DRSG PAD ABDOMINAL 8X10 ST (GAUZE/BANDAGES/DRESSINGS) ×4 IMPLANT
GLOVE BIO SURGEON STRL SZ8 (GLOVE) ×2 IMPLANT
GLOVE BIOGEL PI IND STRL 8 (GLOVE) ×1 IMPLANT
GLOVE BIOGEL PI INDICATOR 8 (GLOVE) ×1
GLOVE ECLIPSE 6.5 STRL STRAW (GLOVE) ×2 IMPLANT
GLOVE INDICATOR 6.5 STRL GRN (GLOVE) ×2 IMPLANT
GOWN PREVENTION PLUS XLARGE (GOWN DISPOSABLE) ×2 IMPLANT
GOWN PREVENTION PLUS XXLARGE (GOWN DISPOSABLE) ×6 IMPLANT
IMMOBILIZER KNEE 22 UNIV (SOFTGOODS) ×2 IMPLANT
K-WIRE ACE 1.6X6 (WIRE) ×6
KWIRE ACE 1.6X6 (WIRE) ×3 IMPLANT
NEEDLE HYPO 22GX1.5 SAFETY (NEEDLE) IMPLANT
NS IRRIG 1000ML POUR BTL (IV SOLUTION) ×2 IMPLANT
PACK BASIN DAY SURGERY FS (CUSTOM PROCEDURE TRAY) ×2 IMPLANT
PAD CAST 4YDX4 CTTN HI CHSV (CAST SUPPLIES) ×2 IMPLANT
PADDING CAST ABS 4INX4YD NS (CAST SUPPLIES)
PADDING CAST ABS COTTON 4X4 ST (CAST SUPPLIES) IMPLANT
PADDING CAST COTTON 4X4 STRL (CAST SUPPLIES) ×2
PADDING CAST COTTON 6X4 STRL (CAST SUPPLIES) ×2 IMPLANT
PENCIL BUTTON HOLSTER BLD 10FT (ELECTRODE) ×2 IMPLANT
PLATE LOCK 3H STD RT PROX TIB (Plate) ×2 IMPLANT
SCREW CORTICAL 3.5MM  42MM (Screw) ×1 IMPLANT
SCREW CORTICAL 3.5MM 36MM (Screw) ×2 IMPLANT
SCREW CORTICAL 3.5MM 42MM (Screw) ×1 IMPLANT
SCREW LOCK 3.5X70 DIST TIB (Screw) ×4 IMPLANT
SCREW LOCK CORT STAR 3.5X54 (Screw) ×2 IMPLANT
SCREW LOCK CORT STAR 3.5X56 (Screw) ×2 IMPLANT
SCREW LOCK CORT STAR 3.5X60 (Screw) ×2 IMPLANT
SCREW LP 3.5X60MM (Screw) ×2 IMPLANT
SCREW LP 3.5X70MM (Screw) ×4 IMPLANT
SHEET MEDIUM DRAPE 40X70 STRL (DRAPES) ×2 IMPLANT
SLEEVE SCD COMPRESS KNEE MED (MISCELLANEOUS) ×2 IMPLANT
SPLINT FAST PLASTER 5X30 (CAST SUPPLIES) ×20
SPLINT PLASTER CAST FAST 5X30 (CAST SUPPLIES) ×20 IMPLANT
SPONGE GAUZE 4X4 12PLY (GAUZE/BANDAGES/DRESSINGS) ×2 IMPLANT
SPONGE LAP 18X18 X RAY DECT (DISPOSABLE) ×2 IMPLANT
STAPLER VISISTAT 35W (STAPLE) ×2 IMPLANT
STOCKINETTE 6  STRL (DRAPES) ×1
STOCKINETTE 6 STRL (DRAPES) ×1 IMPLANT
STRIP CLOSURE SKIN 1/2X4 (GAUZE/BANDAGES/DRESSINGS) IMPLANT
SUCTION FRAZIER TIP 10 FR DISP (SUCTIONS) ×2 IMPLANT
SUT ETHIBOND 0 MO6 C/R (SUTURE) ×2 IMPLANT
SUT ETHILON 3 0 PS 1 (SUTURE) ×2 IMPLANT
SUT MNCRL AB 3-0 PS2 18 (SUTURE) ×4 IMPLANT
SUT MNCRL AB 4-0 PS2 18 (SUTURE) IMPLANT
SUT VIC AB 0 CT1 27 (SUTURE) ×2
SUT VIC AB 0 CT1 27XBRD ANBCTR (SUTURE) ×2 IMPLANT
SUT VIC AB 0 SH 27 (SUTURE) IMPLANT
SUT VIC AB 2-0 SH 27 (SUTURE)
SUT VIC AB 2-0 SH 27XBRD (SUTURE) IMPLANT
SUT VICRYL 4-0 PS2 18IN ABS (SUTURE) IMPLANT
SYR 20CC LL (SYRINGE) IMPLANT
SYR BULB 3OZ (MISCELLANEOUS) ×2 IMPLANT
TUBE CONNECTING 20X1/4 (TUBING) ×2 IMPLANT
UNDERPAD 30X30 INCONTINENT (UNDERPADS AND DIAPERS) ×2 IMPLANT

## 2012-05-05 NOTE — Anesthesia Preprocedure Evaluation (Addendum)
Anesthesia Evaluation  Patient identified by MRN, date of birth, ID band Patient awake    Reviewed: Allergy & Precautions, H&P , NPO status , Patient's Chart, lab work & pertinent test results  History of Anesthesia Complications Negative for: history of anesthetic complications  Airway Mallampati: II TM Distance: >3 FB Neck ROM: Full    Dental  (+) Teeth Intact and Dental Advisory Given   Pulmonary pneumonia -, resolved, former smoker,    Pulmonary exam normal       Cardiovascular hypertension, Pt. on medications     Neuro/Psych PSYCHIATRIC DISORDERS Depression negative neurological ROS     GI/Hepatic negative GI ROS, Neg liver ROS,   Endo/Other  Morbid obesity  Renal/GU negative Renal ROS     Musculoskeletal   Abdominal   Peds  Hematology   Anesthesia Other Findings   Reproductive/Obstetrics                           Anesthesia Physical Anesthesia Plan  ASA: II  Anesthesia Plan: General   Post-op Pain Management:    Induction: Intravenous  Airway Management Planned: LMA  Additional Equipment:   Intra-op Plan:   Post-operative Plan: Extubation in OR  Informed Consent: I have reviewed the patients History and Physical, chart, labs and discussed the procedure including the risks, benefits and alternatives for the proposed anesthesia with the patient or authorized representative who has indicated his/her understanding and acceptance.   Dental advisory given  Plan Discussed with: CRNA, Anesthesiologist and Surgeon  Anesthesia Plan Comments:         Anesthesia Quick Evaluation

## 2012-05-05 NOTE — Anesthesia Postprocedure Evaluation (Signed)
Anesthesia Post Note  Patient: Elizabeth Tanner  Procedure(s) Performed: Procedure(s) (LRB): OPEN REDUCTION INTERNAL FIXATION TIBIAL PLATEAU (Right)  Anesthesia type: general  Patient location: PACU  Post pain: Pain level controlled  Post assessment: Patient's Cardiovascular Status Stable  Last Vitals:  Filed Vitals:   05/05/12 1245  BP: 164/108  Pulse: 114  Temp:   Resp: 29    Post vital signs: Reviewed and stable  Level of consciousness: sedated  Complications: No apparent anesthesia complications

## 2012-05-05 NOTE — Brief Op Note (Signed)
05/05/2012  11:37 AM  PATIENT:  Elizabeth Tanner  42 y.o. female  PRE-OPERATIVE DIAGNOSIS:  Right tibial plateau fracture (schatzker 2)  POST-OPERATIVE DIAGNOSIS:  Right tibial plateau fracture  Procedure(s): OPEN REDUCTION INTERNAL FIXATION TIBIAL PLATEAU fracture  SURGEON:  [name], MD  ASSISTANT: n/a  ANESTHESIA:   General, regional  EBL:  minimal   TOURNIQUET:   Total Tourniquet Time Documented: Thigh (Right) - 120 minutes Total: Thigh (Right) - 120 minutes   COMPLICATIONS:  None apparent  DISPOSITION:  Extubated, awake and stable to recovery.  DICTATION ID:   [id number]

## 2012-05-05 NOTE — H&P (Signed)
Elizabeth Tanner is an 42 y.o. female.   Chief Complaint: right tibial plateau fracture HPI: 42 y/o female with a fall last week on ice injuring the right knee.  CT scan and xrays reveal a dsiplaced split depression lateral tibial plateau fracture.  She presents now for operative treatment.  Past Medical History  Diagnosis Date  . Depression     ? atypical  . Dysmenorrhea   . Gluten intolerance     self reported  . Fracture of fibula     rt  . History of tobacco use   . Knee injury     fall on snow fracture Elizabeth Tanner  . Wears glasses   . Pneumonia 8 12     LLL -clear now  . Hypertension     Past Surgical History  Procedure Laterality Date  . Breast reduction surgery  2001  . Mouth surgery      Family History  Problem Relation Age of Onset  . Alcohol abuse    . Thyroid disease    . Arthritis      Father has a fused spine uncle with DJD  . Heart attack Maternal Uncle 30    Electrical problem also maternal aunt  . Melanoma Cousin   . Other Maternal Aunt     68 pacer.    Social History:  reports that she has quit smoking. Her smoking use included Cigarettes. She has a 12 pack-year smoking history. She has never used smokeless tobacco. She reports that  drinks alcohol. She reports that she does not use illicit drugs.  Allergies:  Allergies  Allergen Reactions  . Azithromycin Itching    Reports similar to erythromycin  . Amoxicillin Itching  . Gluten   . Vicodin (Hydrocodone-Acetaminophen) Nausea Only  . Penicillins Rash    Medications Prior to Admission  Medication Sig Dispense Refill  . DULoxetine (CYMBALTA) 60 MG capsule Take 120 mg by mouth daily. 2 daily      . lamoTRIgine (LAMICTAL) 100 MG tablet Take 100 mg by mouth 2 (two) times daily.       . medroxyPROGESTERone (DEPO-PROVERA) 150 MG/ML injection Inject 150 mg into the muscle every 3 (three) months.        Marland Kitchen OVER THE COUNTER MEDICATION 1 tablet. True focus amino acids      . oxyCODONE-acetaminophen  (PERCOCET/ROXICET) 5-325 MG per tablet Take 2 tablets by mouth every 6 (six) hours as needed for pain.  20 tablet  0  . triamterene-hydrochlorothiazide (MAXZIDE-25) 37.5-25 MG per tablet TAKE ONE TABLET BY MOUTH ONE TIME DAILY  90 tablet  1    Results for orders placed during the hospital encounter of 05/05/12 (from the past 48 hour(s))  POCT I-STAT, CHEM 8     Status: Abnormal   Collection Time    05/05/12  7:38 AM      Result Value Range   Sodium 138  135 - 145 mEq/L   Potassium 3.6  3.5 - 5.1 mEq/L   Chloride 105  96 - 112 mEq/L   BUN 9  6 - 23 mg/dL   Creatinine, Ser 1.61  0.50 - 1.10 mg/dL   Glucose, Bld 096 (*) 70 - 99 mg/dL   Calcium, Ion 0.45  4.09 - 1.23 mmol/L   TCO2 26  0 - 100 mmol/L   Hemoglobin 14.3  12.0 - 15.0 g/dL   HCT 81.1  91.4 - 78.2 %  POCT HEMOGLOBIN-HEMACUE     Status: None   Collection Time  05/08/12  7:39 AM      Result Value Range   Hemoglobin 14.6  12.0 - 15.0 g/dL   No results found.  ROS  No recent f/c/n/v/wt loss  Blood pressure 140/91, pulse 94, temperature 98.3 F (36.8 C), temperature source Oral, resp. rate 18, height 4\' 11"  (1.499 m), weight 78.926 kg (174 lb), SpO2 94.00%. Physical Exam  wn wd woman in nad.  A and O x 4.  Mood and affect normal.  EOMI.  Respiration unlabored.  R knee with healthy and intact skin.  Sens to LT intact throughout R LE.  5/5 strength in PF and DF of the right ankle.  2+ dp and PT pulses.  No lymphadenopathy noted at R LE.  Assessment/Plan R tibial plateau fracture (schatzker 2) - to OR for ORIF.  The risks and benefits of the alternative treatment options have been discussed in detail.  The patient wishes to proceed with surgery and specifically understands risks of bleeding, infection, nerve damage, blood clots, need for additional surgery, amputation and death.   [name] May 08, 2012, 8:33 AM

## 2012-05-05 NOTE — Transfer of Care (Signed)
Immediate Anesthesia Transfer of Care Note  Patient: Elizabeth Tanner  Procedure(s) Performed: Procedure(s): OPEN REDUCTION INTERNAL FIXATION TIBIAL PLATEAU (Right)  Patient Location: PACU  Anesthesia Type:General  Level of Consciousness: awake and sedated  Airway & Oxygen Therapy: Patient Spontanous Breathing and Patient connected to face mask oxygen  Post-op Assessment: Report given to PACU RN and Post -op Vital signs reviewed and stable  Post vital signs: Reviewed and stable  Complications: No apparent anesthesia complications

## 2012-05-05 NOTE — Op Note (Signed)
Elizabeth Tanner, Elizabeth Tanner NO.:  [id number]  MEDICAL RECORD NO.:  [id number]  LOCATION:                                 FACILITY:  PHYSICIAN:  [name], MD        DATE OF BIRTH:  [birth year]  DATE OF PROCEDURE:  05/05/2012 DATE OF DISCHARGE:                              OPERATIVE REPORT   PREOPERATIVE DIAGNOSIS:  Right Schatzker 2 tibial plateau fracture.  POSTOPERATIVE DIAGNOSIS:  Right Schatzker 2 tibial plateau fracture.  PROCEDURE:  Open reduction and internal fixation of right Schatzker 2 tibial plateau fracture.  SURGEON:  [name], MD  ANESTHESIA:  General, regional.  ESTIMATED BLOOD LOSS:  Minimal.  TOURNIQUET TIME:  120 minutes at 300 mmHg.  COMPLICATIONS:  None apparent.  DISPOSITION:  Extubated awake and stable to recovery.  INDICATIONS FOR PROCEDURE:  The patient is a 42 year old woman who fell on the ice last week injuring the right knee.  X-rays and CT scan revealed a Schatzker 2 tibial plateau fracture.  This is displaced.  She presents now for operative treatment of this displaced intra-articular fracture.  She understands the risks and benefits, the alternative treatment options and elects surgical treatment.  She specifically understands risks of bleeding, infection, nerve damage, blood clots, need for additional surgery, amputation, and death.  PROCEDURE IN DETAIL:  After preoperative consent was obtained, the correct operative site was identified.  The patient was brought to the operating room and placed supine on the operating table.  General anesthesia was induced.  Preoperative antibiotics were administered. Surgical time-out was taken.  Right lower extremity was prepped and draped in standard sterile fashion with tourniquet around the thigh. The extremity was exsanguinated and tourniquet was inflated to 300 mmHg. An S-shaped curvilinear incision was made over the lateral aspect of the knee extending to Gerdy's tubercle.   Sharp dissection was carried down through the skin.  Blunt dissection was carried down through the subcutaneous tissue.  The IT band was split in line with its fibers at the level of Gerdy's tubercle.  The anterior compartment fascia was incised.  The anterior compartment musculature was elevated subperiosteally exposing fracture site at the anterolateral aspect of the proximal tibia.  A submeniscal arthrotomy was made into the lateral meniscus.  The lateral meniscus was inspected and was noted to be intact with no evidence of tear.  The chondral surface was noted to be quite impacted particularly posteriorly.  Fracture was opened and impactors were used to elevate the subchondral bone in a slightly over reduced position.  There was a single piece of free cartilage in the back of the knee joint.  This was removed.  Arthrotomy was irrigated copiously of all hematoma allowing good visualization of the fracture line with the use of a head lamp.  Once the subchondral bone was elevated adequately, cancellous allograft, bone chips were packed into the void in the metaphyseal area.  Once the void was filled, the fracture was closed into a reduced position and held with a K-wire.  A 3 hole proximal tibial plate was selected from the [employer] set.  It was inserted subperiosteally and pinned in position.  AP  and lateral fluoroscopic images confirmed appropriate position of the plate.  The King tong was placed through a stab incision at the medial tibial plateau and used to compress the fracture line.  The most anterior proximal hole in the plate was then selected and drilled with a 2.5 mm drill bit just below the subchondral bone.  AP fluoroscopic imaging was used to confirm appropriate position of this drill hole.  A 3.5 mm nonlocking screw was inserted compressing the fracture site appropriately.  A second 3.5 nonlocking screw was inserted in the next most distal hole in the plate padding to the  compression across the fracture site.  The next 2 most posterior holes of the proximal row were drilled and filled with locking screws supporting the subchondral bone.  Distally, the plate was fixed to the bone with a 3.5 mm fully-threaded cortical screw.  This pulled the plate securely down to the bone.  In the 2nd row, a 2nd locking screw was added in the most posterior hole.  Two more locking screws were added in the kickstand positions on the plate.  AP and lateral fluoroscopic images were then obtained confirming appropriate reduction of the joint line and appropriate position and length of all hardware. The wound was irrigated copiously.  The meniscal arthrotomy was repaired with 0 Ethibond sutures.  The IT band was repaired with figure-of-eight sutures of 0 Vicryl.  The subcutaneous tissue was approximated with inverted simple sutures of 3-0 Monocryl.  Staples were used to close the skin incision.  The subcutaneous tissue was infiltrated with 30 mL of 0.5% plain Marcaine.  Tourniquet was released in 2 hours.  Hemostasis was achieved prior to closure.  Sterile dressings were applied followed by a long leg compressive wrap.  Knee immobilizer was applied.  The patient was then awakened from anesthesia and transported to the recovery room in stable condition.  FOLLOWUP PLAN:  The patient will be observed overnight for pain control. She will be discharged home tomorrow.  She will follow up with me in 2 weeks for removal of her staples and initiation of knee range of motion.     [name], MD     JH/MEDQ  D:  05/05/2012  T:  05/05/2012  Job:  [id number]

## 2012-05-09 ENCOUNTER — Encounter (HOSPITAL_BASED_OUTPATIENT_CLINIC_OR_DEPARTMENT_OTHER): Payer: Self-pay | Admitting: Orthopedic Surgery

## 2012-07-08 ENCOUNTER — Ambulatory Visit: Payer: BC Managed Care – PPO | Admitting: Family Medicine

## 2012-07-19 ENCOUNTER — Ambulatory Visit: Payer: BC Managed Care – PPO | Admitting: Family Medicine

## 2012-08-08 ENCOUNTER — Other Ambulatory Visit: Payer: Self-pay | Admitting: Internal Medicine

## 2012-08-29 ENCOUNTER — Ambulatory Visit (INDEPENDENT_AMBULATORY_CARE_PROVIDER_SITE_OTHER): Payer: BC Managed Care – PPO | Admitting: Internal Medicine

## 2012-08-29 ENCOUNTER — Encounter: Payer: Self-pay | Admitting: Internal Medicine

## 2012-08-29 VITALS — BP 124/88 | Temp 98.5°F | Wt 181.0 lb

## 2012-08-29 DIAGNOSIS — F329 Major depressive disorder, single episode, unspecified: Secondary | ICD-10-CM

## 2012-08-29 DIAGNOSIS — I10 Essential (primary) hypertension: Secondary | ICD-10-CM

## 2012-08-29 DIAGNOSIS — Z87891 Personal history of nicotine dependence: Secondary | ICD-10-CM

## 2012-08-29 DIAGNOSIS — R7989 Other specified abnormal findings of blood chemistry: Secondary | ICD-10-CM

## 2012-08-29 DIAGNOSIS — R945 Abnormal results of liver function studies: Secondary | ICD-10-CM

## 2012-08-29 DIAGNOSIS — E785 Hyperlipidemia, unspecified: Secondary | ICD-10-CM

## 2012-08-29 LAB — CBC WITH DIFFERENTIAL/PLATELET
Basophils Relative: 0.5 % (ref 0.0–3.0)
Eosinophils Relative: 1.4 % (ref 0.0–5.0)
HCT: 43.2 % (ref 36.0–46.0)
Hemoglobin: 14.7 g/dL (ref 12.0–15.0)
Lymphs Abs: 3.4 10*3/uL (ref 0.7–4.0)
MCV: 97.5 fl (ref 78.0–100.0)
Monocytes Absolute: 0.6 10*3/uL (ref 0.1–1.0)
Monocytes Relative: 6.5 % (ref 3.0–12.0)
Neutro Abs: 5.3 10*3/uL (ref 1.4–7.7)
RBC: 4.43 Mil/uL (ref 3.87–5.11)
WBC: 9.5 10*3/uL (ref 4.5–10.5)

## 2012-08-29 LAB — TSH: TSH: 1.67 u[IU]/mL (ref 0.35–5.50)

## 2012-08-29 LAB — BASIC METABOLIC PANEL
BUN: 13 mg/dL (ref 6–23)
CO2: 29 mEq/L (ref 19–32)
GFR: 104.08 mL/min (ref >60.00)
Glucose, Bld: 91 mg/dL (ref 70–99)
Potassium: 3.2 mEq/L — ABNORMAL LOW (ref 3.5–5.1)

## 2012-08-29 LAB — LIPID PANEL
Cholesterol: 180 mg/dL (ref 0–200)
VLDL: 23.4 mg/dL (ref 0.0–40.0)

## 2012-08-29 LAB — HEPATIC FUNCTION PANEL
ALT: 26 U/L (ref 0–35)
AST: 20 U/L (ref 0–37)
Albumin: 4 g/dL (ref 3.5–5.2)
Alkaline Phosphatase: 66 U/L (ref 39–117)
Total Protein: 7 g/dL (ref 6.0–8.3)

## 2012-08-29 NOTE — Progress Notes (Signed)
Chief Complaint  Patient presents with  . Follow-up    HPI:  Last visit was over a year ago.  Since that time  had right tibial plateua fracture in February  And open reduction  Dr Victorino Dike.   She is decided to try to get off all of her medications because they don't really work for her depression. She had missed her appointments because of the fracture and surgery of her right knee.   She has weaned off of the Cymbalta and off of that about a week. She is decreasing the Lamictal by 25 mg at a time.  She is off depo since April   Decreasing lamictal and hard to get off.    She is reading a book about mood and other treatments. A book as detailed with information about supplements. However she is only taking niacin and HDP.  BP  medication she is continuing related. He feels it is only up with work stress.  Judges about how she feels.     And  taking niacin about a week  ;3000 mg per day     Some flushing.     Pt says she is aware of this potentia but says it is supposed to be good for  Depression.   5 htp. 4 per d  No etoh   Still  Tobacco but with no added ingredients?? ROS: See pertinent positives and negatives per HPI. No need fo r  contraception at present .   Past Medical History  Diagnosis Date  . Depression     ? atypical  . Dysmenorrhea   . Gluten intolerance     self reported  . Fracture of fibula     rt  . History of tobacco use   . Knee injury     fall on snow fracture hewitt  . Wears glasses   . Pneumonia 8 12     LLL -clear now  . Hypertension     Family History  Problem Relation Age of Onset  . Alcohol abuse    . Thyroid disease    . Arthritis      Father has a fused spine uncle with DJD  . Heart attack Maternal Uncle 30    Electrical problem also maternal aunt  . Melanoma Cousin   . Other Maternal Aunt     68 pacer.     History   Social History  . Marital Status: Single    Spouse Name: N/A    Number of Children: N/A  . Years of  Education: N/A   Social History Main Topics  . Smoking status: Former Smoker -- 1.50 packs/day for 8 years    Types: Cigarettes  . Smokeless tobacco: Never Used  . Alcohol Use: Yes  . Drug Use: No  . Sexually Active: None     Comment: is smoking a few cigs   Other Topics Concern  . 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                Outpatient Encounter Prescriptions as of 08/29/2012  Medication Sig Dispense Refill  . lamoTRIgine (LAMICTAL) 25 MG tablet Take 25 mg by mouth daily.      Marland Kitchen OVER THE COUNTER MEDICATION 1 tablet. True  focus amino acids      . triamterene-hydrochlorothiazide (MAXZIDE-25) 37.5-25 MG per tablet TAKE ONE TABLET BY MOUTH ONE TIME DAILY  90 tablet  0  . [DISCONTINUED] DULoxetine (CYMBALTA) 60 MG capsule Take 120 mg by mouth daily. 2 daily      . [DISCONTINUED] lamoTRIgine (LAMICTAL) 100 MG tablet Take 100 mg by mouth 2 (two) times daily.       . [DISCONTINUED] medroxyPROGESTERone (DEPO-PROVERA) 150 MG/ML injection Inject 150 mg into the muscle every 3 (three) months.        . [DISCONTINUED] oxyCODONE (ROXICODONE) 5 MG immediate release tablet Take 1-2 tablets (5-10 mg total) by mouth every 4 (four) hours as needed for pain.  50 tablet  0  . [DISCONTINUED] rivaroxaban (XARELTO) 10 MG TABS tablet Take 1 tablet (10 mg total) by mouth daily.  14 tablet  0   No facility-administered encounter medications on file as of 08/29/2012.    EXAM:  BP 124/88  Temp(Src) 98.5 F (36.9 C) (Oral)  Wt 181 lb (82.101 kg)  BMI 36.54 kg/m2  Body mass index is 36.54 kg/(m^2).  GENERAL: vitals reviewed and listed above, alert, oriented, appears well hydrated and in no acute distress very red and flushed but   Non toxic and good eye contact some increase body movement,ent nad fidgeting  HEENT: atraumatic,  conjunctiva  clear, no obvious abnormalities on inspection of external nose and ears OP : no lesion edema or exudate   NECK: no obvious masses on inspection palpation  No adenopathy   LUNGS: clear to auscultation bilaterally, no wheezes, rales or rhonchi, good air movement  CV: HRRR, no clubbing cyanosis or  peripheral edema nl cap refill   MS: moves all extremities without noticeable focal  Abnormality well healed scar right knee  PSYCH: pleasant and cooperative,   Animated alert  Shows me the book she is  Using to help with her moods.   ASSESSMENT AND PLAN:  Discussed the following assessment and plan:  Hypertension - Plan: lamoTRIgine (LAMICTAL) 25 MG tablet, Basic metabolic panel, Hepatic function panel, Lipid panel, TSH, CBC with Differential  LFTs abnormal minor alt - Plan: lamoTRIgine (LAMICTAL) 25 MG tablet, Basic metabolic panel, Hepatic function panel, Lipid panel, TSH, CBC with Differential  Other and unspecified hyperlipidemia - Plan: lamoTRIgine (LAMICTAL) 25 MG tablet, Basic metabolic panel, Hepatic function panel, Lipid panel, TSH, CBC with Differential  History of tobacco use  DEPRESSION Is aware she needs to lose weight to help her health   Risk benefit of medication  Supplements discussed. Pt off the cymbalta and discussed WD sx.   Needs lab  Monitoring   -Patient advised to return or notify health care team  if symptoms worsen or persist or new concerns arise.  Patient Instructions  Strongly advise  add exercise.  Check blood pressure readings    On your own.   Will notify you  of labs when available. Exercise and  weight loss will help.  Caution with supplements  They are medications and can have side effects an cannot guarantee  Purity  . As not regulated .      Neta Mends. Panosh M.D.

## 2012-08-29 NOTE — Patient Instructions (Addendum)
Strongly advise  add exercise.  Check blood pressure readings    On your own.   Will notify you  of labs when available. Exercise and  weight loss will help.  Caution with supplements  They are medications and can have side effects an cannot guarantee  Purity  . As not regulated .

## 2012-09-06 ENCOUNTER — Other Ambulatory Visit: Payer: Self-pay | Admitting: Family Medicine

## 2012-09-06 DIAGNOSIS — E876 Hypokalemia: Secondary | ICD-10-CM

## 2012-10-03 ENCOUNTER — Other Ambulatory Visit (INDEPENDENT_AMBULATORY_CARE_PROVIDER_SITE_OTHER): Payer: BC Managed Care – PPO

## 2012-10-03 DIAGNOSIS — E876 Hypokalemia: Secondary | ICD-10-CM

## 2012-10-10 ENCOUNTER — Encounter: Payer: Self-pay | Admitting: Family Medicine

## 2013-01-19 ENCOUNTER — Other Ambulatory Visit: Payer: Self-pay

## 2013-01-30 ENCOUNTER — Other Ambulatory Visit: Payer: Self-pay | Admitting: Family Medicine

## 2013-01-30 MED ORDER — TRIAMTERENE-HCTZ 37.5-25 MG PO TABS: ORAL_TABLET | ORAL | Status: DC

## 2013-02-06 ENCOUNTER — Encounter: Payer: Self-pay | Admitting: Internal Medicine

## 2013-02-06 ENCOUNTER — Ambulatory Visit (INDEPENDENT_AMBULATORY_CARE_PROVIDER_SITE_OTHER): Payer: BC Managed Care – PPO | Admitting: Internal Medicine

## 2013-02-06 VITALS — BP 130/86 | HR 93 | Temp 98.5°F | Wt 182.0 lb

## 2013-02-06 DIAGNOSIS — J329 Chronic sinusitis, unspecified: Secondary | ICD-10-CM

## 2013-02-06 DIAGNOSIS — J22 Unspecified acute lower respiratory infection: Secondary | ICD-10-CM | POA: Insufficient documentation

## 2013-02-06 DIAGNOSIS — J988 Other specified respiratory disorders: Secondary | ICD-10-CM

## 2013-02-06 MED ORDER — DOXYCYCLINE HYCLATE 100 MG PO CAPS: 100.0000 mg | ORAL_CAPSULE | Freq: Two times a day (BID) | ORAL | Status: DC

## 2013-02-06 NOTE — Patient Instructions (Signed)
You have an upper resp[iratory infection . Sinus infection that often resolves on its  Own with time . If not improving after 7-10 days of illness and or severe face pain than can add  Antibiotic to help resolve this  Saline nose sprays and OTC meds and help  symptoms until get better .   Sinusitis Sinusitis is redness, soreness, and swelling (inflammation) of the paranasal sinuses. Paranasal sinuses are air pockets within the bones of your face (beneath the eyes, the middle of the forehead, or above the eyes). In healthy paranasal sinuses, mucus is able to drain out, and air is able to circulate through them by way of your nose. However, when your paranasal sinuses are inflamed, mucus and air can become trapped. This can allow bacteria and other germs to grow and cause infection. Sinusitis can develop quickly and last only a short time (acute) or continue over a long period (chronic). Sinusitis that lasts for more than 12 weeks is considered chronic.  CAUSES  Causes of sinusitis include:  Allergies.  Structural abnormalities, such as displacement of the cartilage that separates your nostrils (deviated septum), which can decrease the air flow through your nose and sinuses and affect sinus drainage.  Functional abnormalities, such as when the small hairs (cilia) that line your sinuses and help remove mucus do not work properly or are not present. SYMPTOMS  Symptoms of acute and chronic sinusitis are the same. The primary symptoms are pain and pressure around the affected sinuses. Other symptoms include:  Upper toothache.  Earache.  Headache.  Bad breath.  Decreased sense of smell and taste.  A cough, which worsens when you are lying flat.  Fatigue.  Fever.  Thick drainage from your nose, which often is green and may contain pus (purulent).  Swelling and warmth over the affected sinuses. DIAGNOSIS  Your caregiver will perform a physical exam. During the exam, your caregiver  may:  Look in your nose for signs of abnormal growths in your nostrils (nasal polyps).  Tap over the affected sinus to check for signs of infection.  View the inside of your sinuses (endoscopy) with a special imaging device with a light attached (endoscope), which is inserted into your sinuses. If your caregiver suspects that you have chronic sinusitis, one or more of the following tests may be recommended:  Allergy tests.  Nasal culture A sample of mucus is taken from your nose and sent to a lab and screened for bacteria.  Nasal cytology A sample of mucus is taken from your nose and examined by your caregiver to determine if your sinusitis is related to an allergy. TREATMENT  Most cases of acute sinusitis are related to a viral infection and will resolve on their own within 10 days. Sometimes medicines are prescribed to help relieve symptoms (pain medicine, decongestants, nasal steroid sprays, or saline sprays).  However, for sinusitis related to a bacterial infection, your caregiver will prescribe antibiotic medicines. These are medicines that will help kill the bacteria causing the infection.  Rarely, sinusitis is caused by a fungal infection. In theses cases, your caregiver will prescribe antifungal medicine. For some cases of chronic sinusitis, surgery is needed. Generally, these are cases in which sinusitis recurs more than 3 times per year, despite other treatments. HOME CARE INSTRUCTIONS   Drink plenty of water. Water helps thin the mucus so your sinuses can drain more easily.  Use a humidifier.  Inhale steam 3 to 4 times a day (for example, sit in the  bathroom with the shower running).  Apply a warm, moist washcloth to your face 3 to 4 times a day, or as directed by your caregiver.  Use saline nasal sprays to help moisten and clean your sinuses.  Take over-the-counter or prescription medicines for pain, discomfort, or fever only as directed by your caregiver. SEEK IMMEDIATE  MEDICAL CARE IF:  You have increasing pain or severe headaches.  You have nausea, vomiting, or drowsiness.  You have swelling around your face.  You have vision problems.  You have a stiff neck.  You have difficulty breathing. MAKE SURE YOU:   Understand these instructions.  Will watch your condition.  Will get help right away if you are not doing well or get worse. Document Released: 03/02/2005 Document Revised: 05/25/2011 Document Reviewed: 03/17/2011 [facility] Patient Information 2014 [place], [place].

## 2013-02-06 NOTE — Progress Notes (Signed)
Chief Complaint  Patient presents with  . Nasal Congestion    Green nasal congestion.  Started last Thursday.  Started taking Mucinex yesterday.  . Generalized Body Aches  . Sore Throat  . Headache  . Cough    HPI: Patient comes in today for SDA for  new problem evaluation. Thinks she has a sinus infection   Onset 4-5 days of above Green dc and phlegm  No blood  Nasal discharge  Sinus pain and gums hurt and head with tis. No fever. Began 5 days ago.  otc mucinex ans sinus rinses some help . To go to family for t giving  ? Communicability.? Thanksgiving travel. ROS: See pertinent positives and negatives per HPI.  Past Medical History  Diagnosis Date  . Depression     ? atypical  . Dysmenorrhea   . Gluten intolerance     self reported  . Fracture of fibula     rt  . History of tobacco use   . Knee injury     fall on snow fracture hewitt  . Wears glasses   . Pneumonia 8 12     LLL -clear now  . Hypertension     Family History  Problem Relation Age of Onset  . Alcohol abuse    . Thyroid disease    . Arthritis      Father has a fused spine uncle with DJD  . Heart attack Maternal Uncle 30    Electrical problem also maternal aunt  . Melanoma Cousin   . Other Maternal Aunt     68 pacer.     History   Social History  . Marital Status: Single    Spouse Name: N/A    Number of Children: N/A  . Years of Education: N/A   Social History Main Topics  . Smoking status: Former Smoker -- 1.50 packs/day for 8 years    Types: Cigarettes  . Smokeless tobacco: Never Used  . Alcohol Use: Yes  . Drug Use: No  . Sexual Activity: None     Comment: is smoking a few cigs   Other Topics Concern  . 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                Outpatient Encounter Prescriptions as of 02/06/2013    Medication Sig  . triamterene-hydrochlorothiazide (MAXZIDE-25) 37.5-25 MG per tablet TAKE ONE TABLET BY MOUTH ONE TIME DAILY  . doxycycline (VIBRAMYCIN) 100 MG capsule Take 1 capsule (100 mg total) by mouth 2 (two) times daily. For sinusitis  . OVER THE COUNTER MEDICATION 1 tablet. True focus amino acids  . OVER THE COUNTER MEDICATION Take 3,000 mg by mouth 1 day or 1 dose. Patient decided to take this for depression self directed  . OVER THE COUNTER MEDICATION 5 HT supplement self directed  . [DISCONTINUED] lamoTRIgine (LAMICTAL) 25 MG tablet Take 25 mg by mouth daily.    EXAM:  BP 130/86  Pulse 93  Temp(Src) 98.5 F (36.9 C) (Oral)  Wt 182 lb (82.555 kg)  SpO2 98%  Body mass index is 36.74 kg/(m^2). WDWN in NAD  quiet respirations; mildly congested  somewhat hoarse. Non toxic . HEENT: Normocephalic ;atraumatic , Eyes;  PERRL, EOMs  Full, lids and conjunctiva  clear,,Ears: no deformities, canals nl, TM landmarks normal, Nose: no deformity or discharge but 2 + congested;face maxillary tender  tender Mouth : OP clear without lesion or edema . Some redness Neck: Supple without adenopathy or masses or bruits Chest:  Clear to A&P without wheezes rales or rhonchi CV:  S1-S2 no gallops or murmurs peripheral perfusion is normal Skin :nl perfusion and no acute rashes   ASSESSMENT AND PLAN:  Discussed the following assessment and plan:  Acute respiratory infection  Sinusitis  Risk benefit of  Antibiotic reviewed .   Expectant management. Add antibiotic if  persistent or progressive  -Patient advised to return or notify health care team  if symptoms worsen or persist or new concerns arise.  Patient Instructions  You have an upper resp[iratory infection . Sinus infection that often resolves on its  Own with time . If not improving after 7-10 days of illness and or severe face pain than can add  Antibiotic to help resolve this  Saline nose sprays and OTC meds and help  symptoms until get  better .   Sinusitis Sinusitis is redness, soreness, and swelling (inflammation) of the paranasal sinuses. Paranasal sinuses are air pockets within the bones of your face (beneath the eyes, the middle of the forehead, or above the eyes). In healthy paranasal sinuses, mucus is able to drain out, and air is able to circulate through them by way of your nose. However, when your paranasal sinuses are inflamed, mucus and air can become trapped. This can allow bacteria and other germs to grow and cause infection. Sinusitis can develop quickly and last only a short time (acute) or continue over a long period (chronic). Sinusitis that lasts for more than 12 weeks is considered chronic.  CAUSES  Causes of sinusitis include:  Allergies.  Structural abnormalities, such as displacement of the cartilage that separates your nostrils (deviated septum), which can decrease the air flow through your nose and sinuses and affect sinus drainage.  Functional abnormalities, such as when the small hairs (cilia) that line your sinuses and help remove mucus do not work properly or are not present. SYMPTOMS  Symptoms of acute and chronic sinusitis are the same. The primary symptoms are pain and pressure around the affected sinuses. Other symptoms include:  Upper toothache.  Earache.  Headache.  Bad breath.  Decreased sense of smell and taste.  A cough, which worsens when you are lying flat.  Fatigue.  Fever.  Thick drainage from your nose, which often is green and may contain pus (purulent).  Swelling and warmth over the affected sinuses. DIAGNOSIS  Your caregiver will perform a physical exam. During the exam, your caregiver may:  Look in your nose for signs of abnormal growths in your nostrils (nasal polyps).  Tap over the affected sinus to check for signs of infection.  View the inside of your sinuses (endoscopy) with a special imaging device with a light attached (endoscope), which is inserted into  your sinuses. If your caregiver suspects that you have chronic sinusitis, one or more of the following tests may be recommended:  Allergy tests.  Nasal culture A sample of mucus is taken from your nose and sent to a lab and screened for bacteria.  Nasal cytology A sample of mucus is taken from your nose and examined by your caregiver to determine if your sinusitis is related to an allergy. TREATMENT  Most cases of acute sinusitis are related to a viral infection and will resolve on their own  within 10 days. Sometimes medicines are prescribed to help relieve symptoms (pain medicine, decongestants, nasal steroid sprays, or saline sprays).  However, for sinusitis related to a bacterial infection, your caregiver will prescribe antibiotic medicines. These are medicines that will help kill the bacteria causing the infection.  Rarely, sinusitis is caused by a fungal infection. In theses cases, your caregiver will prescribe antifungal medicine. For some cases of chronic sinusitis, surgery is needed. Generally, these are cases in which sinusitis recurs more than 3 times per year, despite other treatments. HOME CARE INSTRUCTIONS   Drink plenty of water. Water helps thin the mucus so your sinuses can drain more easily.  Use a humidifier.  Inhale steam 3 to 4 times a day (for example, sit in the bathroom with the shower running).  Apply a warm, moist washcloth to your face 3 to 4 times a day, or as directed by your caregiver.  Use saline nasal sprays to help moisten and clean your sinuses.  Take over-the-counter or prescription medicines for pain, discomfort, or fever only as directed by your caregiver. SEEK IMMEDIATE MEDICAL CARE IF:  You have increasing pain or severe headaches.  You have nausea, vomiting, or drowsiness.  You have swelling around your face.  You have vision problems.  You have a stiff neck.  You have difficulty breathing. MAKE SURE YOU:   Understand these  instructions.  Will watch your condition.  Will get help right away if you are not doing well or get worse. Document Released: 03/02/2005 Document Revised: 05/25/2011 Document Reviewed: 03/17/2011 [facility] Patient Information 2014 [place], [place].      Neta Mends. Panosh M.D.

## 2013-05-21 ENCOUNTER — Ambulatory Visit (INDEPENDENT_AMBULATORY_CARE_PROVIDER_SITE_OTHER): Payer: BC Managed Care – PPO | Admitting: Family Medicine

## 2013-05-21 VITALS — BP 106/76 | HR 87 | Temp 98.0°F | Ht 59.5 in | Wt 181.0 lb

## 2013-05-21 DIAGNOSIS — J019 Acute sinusitis, unspecified: Secondary | ICD-10-CM

## 2013-05-21 DIAGNOSIS — R05 Cough: Secondary | ICD-10-CM

## 2013-05-21 DIAGNOSIS — R059 Cough, unspecified: Secondary | ICD-10-CM

## 2013-05-21 DIAGNOSIS — J069 Acute upper respiratory infection, unspecified: Secondary | ICD-10-CM

## 2013-05-21 MED ORDER — DOXYCYCLINE HYCLATE 100 MG PO CAPS: 100.0000 mg | ORAL_CAPSULE | Freq: Two times a day (BID) | ORAL | Status: DC

## 2013-05-21 NOTE — Progress Notes (Signed)
Chief Complaint:  Chief Complaint  Patient presents with  . Fever    Been sick since Thursday and been coughing up some crap with nasal congestion  . Generalized Body Aches    HPI: Elizabeth Tanner is a 43 y.o. female who is here for Fever Tmax 101,4, msk aches all over, she was coughing up a little stuff, yellow dc from nose. + facial pain, ear feels clogged, sinus pressure an dgums get hurt, She is not SOB or wheezing She has tried She has been taking ibuprofena nd tylenol, she has not left the house since she has been sick No sore throat, + pressure    Past Medical History  Diagnosis Date  . Depression     ? atypical  . Dysmenorrhea   . Gluten intolerance     self reported  . Fracture of fibula     rt  . History of tobacco use   . Knee injury     fall on snow fracture hewitt  . Wears glasses   . Pneumonia 8 12     LLL -clear now  . Hypertension    Past Surgical History  Procedure Laterality Date  . Breast reduction surgery  2001  . Mouth surgery    . Orif tibia plateau Right 05/05/2012    Procedure: OPEN REDUCTION INTERNAL FIXATION TIBIAL PLATEAU;  Surgeon: Wylene Simmer, MD;  Location: [facility];  Service: Orthopedics;  Laterality: Right;   History   Social History  . Marital Status: Single    Spouse Name: N/A    Number of Children: N/A  . Years of Education: N/A   Social History Main Topics  . Smoking status: Former Smoker -- 1.50 packs/day for 8 years    Types: Cigarettes  . Smokeless tobacco: Never Used  . Alcohol Use: Yes  . Drug Use: No  . Sexual Activity: None     Comment: is smoking a few cigs   Other Topics Concern  . 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               Family History  Problem Relation Age of Onset  . Alcohol abuse    . Thyroid disease      . Arthritis      Father has a fused spine uncle with DJD  . Heart attack Maternal Uncle 30    Electrical problem also maternal aunt  . Melanoma Cousin   . Other Maternal Aunt     68 pacer.    Allergies  Allergen Reactions  . Azithromycin Itching    Reports similar to erythromycin  . Amoxicillin Itching  . Gluten   . Vicodin [Hydrocodone-Acetaminophen] Nausea Only  . Penicillins Rash   Prior to Admission medications   Medication Sig Start Date End Date Taking? Authorizing Provider  ibuprofen (ADVIL,MOTRIN) 200 MG tablet Take 200 mg by mouth every 6 (six) hours as needed.   Yes Historical Provider, MD  OVER THE COUNTER MEDICATION 5 HT supplement self directed   Yes Historical Provider, MD  triamterene-hydrochlorothiazide (MAXZIDE-25) 37.5-25 MG per tablet TAKE ONE TABLET BY MOUTH ONE TIME DAILY 01/30/13  Yes Burnis Medin, MD  doxycycline (VIBRAMYCIN) 100 MG capsule Take 1 capsule (100  mg total) by mouth 2 (two) times daily. For sinusitis 02/06/13   Burnis Medin, MD  OVER THE COUNTER MEDICATION 1 tablet. True focus amino acids    Historical Provider, MD  OVER THE COUNTER MEDICATION Take 3,000 mg by mouth 1 day or 1 dose. Patient decided to take this for depression self directed    Historical Provider, MD     ROS: The patient denies  night sweats, unintentional weight loss, chest pain, palpitations, wheezing, dyspnea on exertion, nausea, vomiting, abdominal pain, dysuria, hematuria, melena, numbness, weakness, or tingling.   All other systems have been reviewed and were otherwise negative with the exception of those mentioned in the HPI and as above.    PHYSICAL EXAM: Filed Vitals:   05/21/13 1110  BP: 106/76  Pulse: 87  Temp: 98 F (36.7 C)   Filed Vitals:   05/21/13 1110  Height: 4' 11.5" (1.511 m)  Weight: 181 lb (82.101 kg)   Body mass index is 35.96 kg/(m^2).  General: Alert, no acute distress HEENT:  Normocephalic, atraumatic, oropharynx patent. EOMI, PERRLA, +  sinu pressure, TM nl Cardiovascular:  Regular rate and rhythm, no rubs murmurs or gallops.  No Carotid bruits, radial pulse intact. No pedal edema.  Respiratory: Clear to auscultation bilaterally.  No wheezes, rales, or rhonchi.  No cyanosis, no use of accessory musculature GI: No organomegaly, abdomen is soft and non-tender, positive bowel sounds.  No masses. Skin: No rashes. Neurologic: Facial musculature symmetric. Psychiatric: Patient is appropriate throughout our interaction. Lymphatic: No cervical lymphadenopathy Musculoskeletal: Gait intact.   LABS: Results for orders placed in visit on 10/03/12  POTASSIUM      Result Value Ref Range   Potassium 3.7  3.5 - 5.1 mEq/L  MAGNESIUM      Result Value Ref Range   Magnesium 2.3  1.5 - 2.5 mg/dL     EKG/XRAY:   Primary read interpreted by Dr. Marin Comment at [facility].   ASSESSMENT/PLAN: Encounter Diagnoses  Name Primary?  . Acute sinusitis Yes  . Cough   . Acute URI    Rx DOxycycline Otc cough meds Decline rx cough meds F/u prn  Gross sideeffects, risk and benefits, and alternatives of medications d/w patient. Patient is aware that all medications have potential sideeffects and we are unable to predict every sideeffect or drug-drug interaction that may occur.  Steph Cheadle, Wallace, DO 05/21/2013 12:14 PM

## 2013-05-21 NOTE — Patient Instructions (Signed)

## 2013-07-31 ENCOUNTER — Telehealth: Payer: Self-pay | Admitting: Internal Medicine

## 2013-07-31 MED ORDER — CYCLOBENZAPRINE HCL 5 MG PO TABS
5.0000 mg | ORAL_TABLET | Freq: Three times a day (TID) | ORAL | Status: DC | PRN
Start: 2013-07-31 — End: 2013-12-13

## 2013-07-31 NOTE — Telephone Encounter (Signed)
[id number] - , [place] - [id number] is requesting re-fill on cyclobenzaprine (FLEXERIL) 10 MG tablet

## 2013-07-31 NOTE — Telephone Encounter (Signed)
Ok to refill i will send in

## 2013-07-31 NOTE — Telephone Encounter (Signed)
See other note

## 2013-07-31 NOTE — Telephone Encounter (Signed)
Pt is needing new rx states its a muscle relaxer, pt was not sure of the name of the meds, states her pharmacy informed her that they faxed it twice, send to [facility] on [address].

## 2013-07-31 NOTE — Telephone Encounter (Signed)
Spoke to the pt.  She complains of back pain that is shooting down her rt arm.  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  Ongoing for 3-4 days now.  Hard to sit at her desk and do her job.  Would like a refill of her muscle relaxer.  Checked the chart and Dr. Regis Bill has given Flexeril in the past.  Please advise.  Thanks!

## 2013-08-01 NOTE — Telephone Encounter (Signed)
Patient notified to pick up at the pharmacy. 

## 2013-08-22 ENCOUNTER — Other Ambulatory Visit: Payer: Self-pay

## 2013-08-22 DIAGNOSIS — Z1231 Encounter for screening mammogram for malignant neoplasm of breast: Secondary | ICD-10-CM

## 2013-08-30 ENCOUNTER — Ambulatory Visit
Admission: RE | Admit: 2013-08-30 | Discharge: 2013-08-30 | Disposition: A | Discharge status: Home / Self Care | Payer: BC Managed Care – PPO | Source: Ambulatory Visit

## 2013-08-30 DIAGNOSIS — Z1231 Encounter for screening mammogram for malignant neoplasm of breast: Secondary | ICD-10-CM

## 2013-12-06 ENCOUNTER — Other Ambulatory Visit (INDEPENDENT_AMBULATORY_CARE_PROVIDER_SITE_OTHER): Payer: BC Managed Care – PPO

## 2013-12-06 DIAGNOSIS — Z0000 Encounter for general adult medical examination without abnormal findings: Secondary | ICD-10-CM

## 2013-12-06 LAB — BASIC METABOLIC PANEL
BUN: 13 mg/dL (ref 6–23)
CO2: 27 mEq/L (ref 19–32)
Calcium: 9.4 mg/dL (ref 8.4–10.5)
Chloride: 103 mEq/L (ref 96–112)
Creatinine, Ser: 0.7 mg/dL (ref 0.4–1.2)
GFR: 101.69 mL/min (ref >60.00)
GLUCOSE: 104 mg/dL — AB (ref 70–99)
POTASSIUM: 4 meq/L (ref 3.5–5.1)
SODIUM: 139 meq/L (ref 135–145)

## 2013-12-06 LAB — CBC WITH DIFFERENTIAL/PLATELET
Basophils Absolute: 0 10*3/uL (ref 0.0–0.1)
Basophils Relative: 0.4 % (ref 0.0–3.0)
EOS PCT: 2.9 % (ref 0.0–5.0)
Eosinophils Absolute: 0.2 10*3/uL (ref 0.0–0.7)
HCT: 42.8 % (ref 36.0–46.0)
Hemoglobin: 14.5 g/dL (ref 12.0–15.0)
Lymphocytes Relative: 32.5 % (ref 12.0–46.0)
Lymphs Abs: 2.1 10*3/uL (ref 0.7–4.0)
MCHC: 34 g/dL (ref 30.0–36.0)
MCV: 92.1 fl (ref 78.0–100.0)
MONO ABS: 0.4 10*3/uL (ref 0.1–1.0)
Monocytes Relative: 6.3 % (ref 3.0–12.0)
NEUTROS PCT: 57.9 % (ref 43.0–77.0)
Neutro Abs: 3.7 10*3/uL (ref 1.4–7.7)
PLATELETS: 296 10*3/uL (ref 150.0–400.0)
RBC: 4.64 Mil/uL (ref 3.87–5.11)
RDW: 13.1 % (ref 11.5–15.5)
WBC: 6.4 10*3/uL (ref 4.0–10.5)

## 2013-12-06 LAB — LIPID PANEL
CHOLESTEROL: 249 mg/dL — AB (ref 0–200)
HDL: 46.4 mg/dL (ref >39.00)
NonHDL: 202.6
Total CHOL/HDL Ratio: 5
Triglycerides: 271 mg/dL — ABNORMAL HIGH (ref 0.0–149.0)
VLDL: 54.2 mg/dL — AB (ref 0.0–40.0)

## 2013-12-06 LAB — HEPATIC FUNCTION PANEL
ALBUMIN: 4.3 g/dL (ref 3.5–5.2)
ALT: 41 U/L — AB (ref 0–35)
AST: 30 U/L (ref 0–37)
Alkaline Phosphatase: 76 U/L (ref 39–117)
Bilirubin, Direct: 0.1 mg/dL (ref 0.0–0.3)
TOTAL PROTEIN: 7.5 g/dL (ref 6.0–8.3)
Total Bilirubin: 0.8 mg/dL (ref 0.2–1.2)

## 2013-12-06 LAB — LDL CHOLESTEROL, DIRECT: LDL DIRECT: 178.5 mg/dL

## 2013-12-06 LAB — TSH: TSH: 2.32 u[IU]/mL (ref 0.35–4.50)

## 2013-12-13 ENCOUNTER — Ambulatory Visit (INDEPENDENT_AMBULATORY_CARE_PROVIDER_SITE_OTHER): Payer: BC Managed Care – PPO | Admitting: Internal Medicine

## 2013-12-13 ENCOUNTER — Encounter: Payer: Self-pay | Admitting: Internal Medicine

## 2013-12-13 VITALS — BP 138/94 | Temp 100.0°F | Ht 59.25 in | Wt 181.0 lb

## 2013-12-13 DIAGNOSIS — R739 Hyperglycemia, unspecified: Secondary | ICD-10-CM

## 2013-12-13 DIAGNOSIS — J0191 Acute recurrent sinusitis, unspecified: Secondary | ICD-10-CM

## 2013-12-13 DIAGNOSIS — Z2821 Immunization not carried out because of patient refusal: Secondary | ICD-10-CM

## 2013-12-13 DIAGNOSIS — E785 Hyperlipidemia, unspecified: Secondary | ICD-10-CM

## 2013-12-13 DIAGNOSIS — N912 Amenorrhea, unspecified: Secondary | ICD-10-CM

## 2013-12-13 DIAGNOSIS — R945 Abnormal results of liver function studies: Secondary | ICD-10-CM

## 2013-12-13 DIAGNOSIS — Z0000 Encounter for general adult medical examination without abnormal findings: Secondary | ICD-10-CM

## 2013-12-13 DIAGNOSIS — I10 Essential (primary) hypertension: Secondary | ICD-10-CM

## 2013-12-13 DIAGNOSIS — R7989 Other specified abnormal findings of blood chemistry: Secondary | ICD-10-CM

## 2013-12-13 DIAGNOSIS — J019 Acute sinusitis, unspecified: Secondary | ICD-10-CM

## 2013-12-13 DIAGNOSIS — R7309 Other abnormal glucose: Secondary | ICD-10-CM

## 2013-12-13 MED ORDER — DOXYCYCLINE HYCLATE 100 MG PO TABS: 100.0000 mg | ORAL_TABLET | Freq: Two times a day (BID) | ORAL | Status: DC

## 2013-12-13 NOTE — Patient Instructions (Addendum)
Treat sinusitis  . antibiotic saline washes fever to be better in 48 hours or less. Intensify lifestyle interventions. For blood sugar cholesterol.  Plan FSH and  Prolactin  And well women exam  And fu in 1-2 months

## 2013-12-13 NOTE — Progress Notes (Signed)
Pre visit review using our clinic review tool, if applicable. No additional management support is needed unless otherwise documented below in the visit note.  Chief Complaint  Patient presents with  . Annual Exam    fever sinus pain     HPI: Patient comes in today for Preventive Health Care visit . However became sick earlier this week with nasal drainage face soreness  Fever  3 days of  Fever body aches  And   Then bright red bilateral yellow nasal drainage  Face huts  And gums hurt upper teeth   And body aches  Pain in gums ocass cough    When moves   .  Took aleve and mucinex and ibuprofen .    No v d unusual rashes  LIPIDS  HBPhas been ok  Didn't take the  diuretic medication   MOOD says stable   Health Maintenance  Topic Date Due  . Pap Smear  08/02/2012  . Influenza Vaccine  11/15/2014 (Originally 10/14/2013)  . Tetanus/tdap  07/24/2018   Health Maintenance Review LIFESTYLE:  Exercise:   Walking dog  Yoga  .  Also  Tobacco/ETS:  No except rarely Alcohol: per day   ocass 1 bottle   Sugar beverages:  Coke caffeine free  "not a lot"  Sleep: ok  Drug use: no Last PAP du Last dep o April 2014   No  Period  Early .  Some hotpflushes   Family has had premature menopause    ROS:  GEN/ HEENT: No fever, significant weight changes sweats headaches vision problems hearing changes, CV/ PULM; No chest pain shortness of breath , syncope,edema  change in exercise tolerance. GI /GU: No adominal pain, vomiting, change in bowel habits. No blood in the stool. No significant GU symptoms. SKIN/HEME: ,no acute skin rashes suspicious lesions or bleeding. No lymphadenopathy, nodules, masses.  NEURO/ PSYCH:  No neurologic signs such as weakness numbness. No depression anxiety. IMM/ Allergy: No unusual infections.  Allergy .   REST of 12 system review negative except as per HPI   Past Medical History  Diagnosis Date  . Depression     ? atypical  . Dysmenorrhea   . Gluten intolerance     self reported  . Fracture of fibula     rt  . History of tobacco use   . Knee injury     fall on snow fracture hewitt  . Wears glasses   . Pneumonia 8 12     LLL -clear now  . Hypertension     Family History  Problem Relation Age of Onset  . Alcohol abuse    . Thyroid disease    . Arthritis      Father has a fused spine uncle with DJD  . Heart attack Maternal Uncle 30    Electrical problem also maternal aunt  . Melanoma Cousin   . Other Maternal Aunt     68 pacer.     History   Social History  . Marital Status: Single    Spouse Name: N/A    Number of Children: N/A  . Years of Education: N/A   Social History Main Topics  . Smoking status: Former Smoker -- 1.50 packs/day for 8 years    Types: Cigarettes  . Smokeless tobacco: Never Used  . Alcohol Use: Yes  . Drug Use: No  . Sexual Activity: None     Comment: is smoking a few cigs   Other Topics Concern  . 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                Outpatient Encounter Prescriptions as of 12/13/2013  Medication Sig  . ibuprofen (ADVIL,MOTRIN) 200 MG tablet Take 200 mg by mouth every 6 (six) hours as needed.  . [DISCONTINUED] OVER THE COUNTER MEDICATION Take 3,000 mg by mouth 1 day or 1 dose. Patient decided to take this for depression self directed  . doxycycline (VIBRA-TABS) 100 MG tablet Take 1 tablet (100 mg total) by mouth 2 (two) times daily.  . [DISCONTINUED] cyclobenzaprine (FLEXERIL) 5 MG tablet Take 1-2 tablets (5-10 mg total) by mouth 3 (three) times daily as needed for muscle spasms.  . [DISCONTINUED] doxycycline (VIBRAMYCIN) 100 MG capsule Take 1 capsule (100 mg total) by mouth 2 (two) times daily. For sinusitis  . [DISCONTINUED] OVER THE COUNTER MEDICATION 1 tablet. True focus amino acids  . [DISCONTINUED] OVER THE COUNTER  MEDICATION 5 HT supplement self directed  . [DISCONTINUED] triamterene-hydrochlorothiazide (MAXZIDE-25) 37.5-25 MG per tablet TAKE ONE TABLET BY MOUTH ONE TIME DAILY    EXAM:  BP 138/94  Temp(Src) 100 F (37.8 C) (Oral)  Ht 4' 11.25" (1.505 m)  Wt 181 lb (82.101 kg)  BMI 36.25 kg/m2  Body mass index is 36.25 kg/(m^2). Wt Readings from Last 3 Encounters:  12/13/13 181 lb (82.101 kg)  05/21/13 181 lb (82.101 kg)  02/06/13 182 lb (82.555 kg)   BP Readings from Last 3 Encounters:  12/13/13 138/94  05/21/13 106/76  02/06/13 130/86     Physical Exam: Vital signs reviewed QMG:QQPY is a well-developed well-nourished alert cooperative    who appearsr stated age in no acute distress.  No toxic mildly ill  HEENT: normocephalic atraumatic , Eyes: PERRL EOM's full, conjunctiva clear, Nares: paten,t congested  Mucoid driange   Facial maxillary tenderness  ., Ears: no deformity EAC's clear TMs with normal landmarks. Mouth: clear OP, no lesions, edema.  Moist mucous membranes. Dentition in adequate repair. NECK: supple without masses, thyromegaly or bruits. CHEST/PULM:  Clear to auscultation and percussion breath sounds equal no wheeze , rales or rhonchi. No chest wall deformities or tenderness. . CV: PMI is nondisplaced, S1 S2 no gallops, murmurs, rubs. Peripheral pulses are full without delay.No JVD .  ABDOMEN: Bowel sounds normal nontender  No guard or rebound, no hepato splenomegal no CVA tenderness.  No hernia. Extremtities:  No clubbing cyanosis or edema, no acute joint swelling or redness no focal atrophy NEURO:  Oriented x3, cranial nerves 3-12 appear to be intact, no obvious focal weakness,gait within normal limits no abnormal reflexes or asymmetrical SKIN: No acute rashes normal turgor, color, no bruising or petechiae. PSYCH: Oriented, good eye contact, no obvious depression anxiety, cognition and judgment appear normal. LN: no cervical  Adenopathy Rest of exam deferred because of  illness   Lab Results  Component Value Date   WBC 6.4 12/06/2013   HGB 14.5 12/06/2013   HCT 42.8 12/06/2013   PLT 296.0 12/06/2013   GLUCOSE 104* 12/06/2013   CHOL 249* 12/06/2013   TRIG 271.0* 12/06/2013   HDL 46.40 12/06/2013   LDLDIRECT 178.5 12/06/2013   LDLCALC 106* 08/29/2012   ALT 41* 12/06/2013   AST 30 12/06/2013   NA 139 12/06/2013   K 4.0  12/06/2013   CL 103 12/06/2013   CREATININE 0.7 12/06/2013   BUN 13 12/06/2013   CO2 27 12/06/2013   TSH 2.32 12/06/2013   HGBA1C 5.4 12/23/2010    ASSESSMENT AND PLAN:  Discussed the following assessment and plan:  Visit for preventive health examination - gyne deferred cause of illness and will come back fo u exam   Acute recurrent sinusitis, unspecified location - empiri antibiotic and fu   Essential hypertension - didnt take med was better recheck after illness  Other and unspecified hyperlipidemia - worse lsi when well pt aware and fu  LFTs abnormal  Hyperglycemia - lsi dysmetabolic  at [place]  follo wup wioth a1c  consider metformin  Influenza vaccination declined  Amenorrhea - over 1 year ater depo pt says not pregnant fam hx of early menopause  check pl fsh pre visit in 1-2 months  Patient Care Team: Burnis Medin, MD as PCP - General Dennard Nip, NP as Nurse Practitioner (Psychiatry) Patient Instructions  Treat sinusitis  .  andtibiotic saline washesfever to be better in 48 hours or less. Intensify lifestyle interventions. For blood sugar iun anolesterol.  Plan FSH and  Prolactin  And well women exam  And fu in 1-2 months    Elizabeth Tanner K. Oddie Bottger M.D.

## 2013-12-14 ENCOUNTER — Telehealth: Payer: Self-pay | Admitting: Internal Medicine

## 2013-12-14 NOTE — Telephone Encounter (Signed)
emmi emailed °

## 2014-05-01 ENCOUNTER — Encounter: Payer: Self-pay | Admitting: Internal Medicine

## 2014-05-01 ENCOUNTER — Ambulatory Visit (INDEPENDENT_AMBULATORY_CARE_PROVIDER_SITE_OTHER): Payer: BC Managed Care – PPO | Admitting: Internal Medicine

## 2014-05-01 VITALS — BP 138/100 | Temp 98.4°F | Ht 59.0 in | Wt 178.2 lb

## 2014-05-01 DIAGNOSIS — R079 Chest pain, unspecified: Secondary | ICD-10-CM

## 2014-05-01 DIAGNOSIS — M25511 Pain in right shoulder: Secondary | ICD-10-CM

## 2014-05-01 DIAGNOSIS — R142 Eructation: Secondary | ICD-10-CM

## 2014-05-01 DIAGNOSIS — R1011 Right upper quadrant pain: Secondary | ICD-10-CM

## 2014-05-01 MED ORDER — TRAMADOL HCL 50 MG PO TABS: 50.0000 mg | ORAL_TABLET | Freq: Three times a day (TID) | ORAL | Status: DC | PRN

## 2014-05-01 MED ORDER — CYCLOBENZAPRINE HCL 5 MG PO TABS: 5.0000 mg | ORAL_TABLET | Freq: Three times a day (TID) | ORAL | Status: DC | PRN

## 2014-05-01 NOTE — Progress Notes (Signed)
Pre visit review using our clinic review tool, if applicable. No additional management support is needed unless otherwise documented below in the visit note.  Chief Complaint  Patient presents with  . Back Pain    X2WKS.  Also burping after she moves around.  . Shoulder Pain    HPI: Patient Elizabeth Tanner  comes in today for SDA for  new problem evaluation.  Feel like poss gb  Hard to lift right and shoulder  Area  And neck  Problem   Burps when touches area on right chest . No fever vomiting .   Stools lighter color.   Lots osf burping and quishier stools  Constipation and alt diarrhea  . Hard to sleep from pain   Over baseline.  Better first in the am and then worse with moving around. o  Insidious onset  Off and on for a while  But getting worse . In past few weeks.     Right handed .  Has pinched nerve  Right arm area.  Had mri  At that time .  Pain meds .  Comes and goes   Pain is enough to not sleep  Tried some left over flexeril  Slight help   Mom just had gb out 2 years ago. Thinks twin sib had gb problems   If righthanded ? What to take for pain so she can sleep ROS: See pertinent positives and negatives per HPI.  Past Medical History  Diagnosis Date  . Depression     ? atypical  . Dysmenorrhea   . Gluten intolerance     self reported  . Fracture of fibula     rt  . History of tobacco use   . Knee injury     fall on snow fracture hewitt  . Wears glasses   . Pneumonia 8 12     LLL -clear now  . Hypertension     Family History  Problem Relation Age of Onset  . Alcohol abuse    . Thyroid disease    . Arthritis      Father has a fused spine uncle with DJD  . Heart attack Maternal Uncle 30    Electrical problem also maternal aunt  . Melanoma Cousin   . Other Maternal Aunt     68 pacer.     History   Social History  . Marital Status: Single    Spouse Name: N/A  . Number of Children: N/A  . Years of Education: N/A   Social History Main Topics    . Smoking status: Former Smoker -- 1.50 packs/day for 8 years    Types: Cigarettes  . Smokeless tobacco: Never Used  . Alcohol Use: Yes  . Drug Use: No  . Sexual Activity: Not on file     Comment: is smoking a few cigs   Other Topics Concern  . 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                Outpatient Encounter Prescriptions as of 05/01/2014  Medication Sig  . cyclobenzaprine (FLEXERIL) 5 MG tablet Take 1 tablet (5 mg total) by mouth 3 (three) times daily as needed for muscle spasms.  . traMADol (ULTRAM) 50 MG tablet Take  1 tablet (50 mg total) by mouth every 8 (eight) hours as needed for moderate pain or severe pain.  . [DISCONTINUED] doxycycline (VIBRA-TABS) 100 MG tablet Take 1 tablet (100 mg total) by mouth 2 (two) times daily.  . [DISCONTINUED] ibuprofen (ADVIL,MOTRIN) 200 MG tablet Take 200 mg by mouth every 6 (six) hours as needed.    EXAM:  BP 138/100 mmHg  Temp(Src) 98.4 F (36.9 C) (Oral)  Ht 4\' 11"  (1.499 m)  Wt 178 lb 3.2 oz (80.831 kg)  BMI 35.97 kg/m2  Body mass index is 35.97 kg/(m^2).  GENERAL: vitals reviewed and listed above, alert, oriented, appears well hydrated and in no acute distress  chornic burping noted   Right shoulder  Slight guarding  HEENT: atraumatic, conjunctiva  clear, no obvious abnormalities on inspection of external nose and ears OP : no lesion edema or exudate  NECK: no obvious masses on inspection palpation  LUNGS: clear to auscultation bilaterally, no wheezes, rales or rhonchi, good air movement Tender cw right lateral no lesion   CV: HRRR, no clubbing cyanosis or  peripheral edema nl cap refill  Abdomen:  Sof,t normal bowel sounds without hepatosplenomegaly, no guarding but tender ruq near rib cage and tender rigb cage lateralluy  rebound or masses no CVA  tenderness Skin 2 cm blotchy ness  Non tender no vesicle MS: moves all extremities without noticeable focal  Abnormality right  shoulder ant pain dec rom internal rotation . No motor deficits obvious  PSYCH: pleasant and cooperative,  Nl speech and thought  Lots of burping   During exam  ASSESSMENT AND PLAN:  Discussed the following assessment and plan:  Right-sided chest pain - chest xray - Plan: DG Chest 2 View, US Abdomen Complete  Burping - Plan: US Abdomen Complete  RUQ pain - Plan: US Abdomen Complete  Right shoulder pain - appears to be separate problem from lat cp and burping poss [facility] pathology avoiding nsaid cuase of gi issues sm amt flex and tramadol in the interim  - Plan: Ambulatory referral to Sports Medicine Poss  gb issue  At risk  Get [place] abd consider cck ef is negative    Dietary change in interim  . Fu depending on results of all tests .  -Patient advised to return or notify health care team  if symptoms worsen ,persist or new concerns arise.  Patient Instructions  Sounds like  Shoulder problem and  GI problem . Get chest x ray  In the interim. To make sure no chest  Issues causing pain.  You will be contacted about sports medicine for the shoulder  Pain  And getting the gall bladder ultrasound.    And then go from there.  If severe pain or fever get emergent evaluation of call on call service . Ok to try the flexeril for spasm.  Tramadol  Short term  Is a controlled subbstance.    Standley Brooking. Arnie Maiolo M.D.

## 2014-05-01 NOTE — Patient Instructions (Signed)
Sounds like  Shoulder problem and  GI problem . Get chest x ray  In the interim. To make sure no chest  Issues causing pain.  You will be contacted about sports medicine for the shoulder  Pain  And getting the gall bladder ultrasound.    And then go from there.  If severe pain or fever get emergent evaluation of call on call service . Ok to try the flexeril for spasm.  Tramadol  Short term  Is a controlled subbstance.

## 2014-05-04 ENCOUNTER — Telehealth: Payer: Self-pay | Admitting: Internal Medicine

## 2014-05-04 ENCOUNTER — Other Ambulatory Visit: Payer: Self-pay | Admitting: Family Medicine

## 2014-05-04 ENCOUNTER — Ambulatory Visit
Admission: RE | Admit: 2014-05-04 | Discharge: 2014-05-04 | Disposition: A | Discharge status: Home / Self Care | Payer: BC Managed Care – PPO | Source: Ambulatory Visit | Attending: Internal Medicine | Admitting: Internal Medicine

## 2014-05-04 ENCOUNTER — Other Ambulatory Visit (HOSPITAL_COMMUNITY): Payer: Self-pay | Admitting: Interventional Radiology

## 2014-05-04 DIAGNOSIS — R1011 Right upper quadrant pain: Secondary | ICD-10-CM

## 2014-05-04 DIAGNOSIS — R142 Eructation: Secondary | ICD-10-CM

## 2014-05-04 DIAGNOSIS — R079 Chest pain, unspecified: Secondary | ICD-10-CM

## 2014-05-04 NOTE — Telephone Encounter (Signed)
Pt would like to know if ultra sound results are back. pls call

## 2014-05-07 ENCOUNTER — Other Ambulatory Visit: Payer: Self-pay | Admitting: Family Medicine

## 2014-05-07 ENCOUNTER — Encounter: Payer: Self-pay | Admitting: Internal Medicine

## 2014-05-07 DIAGNOSIS — R932 Abnormal findings on diagnostic imaging of liver and biliary tract: Secondary | ICD-10-CM

## 2014-05-07 NOTE — Telephone Encounter (Signed)
Pt notified on 05/04/14.  See result note.

## 2014-05-08 ENCOUNTER — Other Ambulatory Visit: Payer: Self-pay | Admitting: Family Medicine

## 2014-05-08 DIAGNOSIS — R932 Abnormal findings on diagnostic imaging of liver and biliary tract: Secondary | ICD-10-CM

## 2014-05-08 DIAGNOSIS — R935 Abnormal findings on diagnostic imaging of other abdominal regions, including retroperitoneum: Secondary | ICD-10-CM

## 2014-05-11 ENCOUNTER — Ambulatory Visit: Payer: BC Managed Care – PPO | Admitting: Family Medicine

## 2014-05-19 ENCOUNTER — Ambulatory Visit
Admission: RE | Admit: 2014-05-19 | Discharge: 2014-05-19 | Disposition: A | Discharge status: Home / Self Care | Payer: BC Managed Care – PPO | Source: Ambulatory Visit | Attending: Internal Medicine | Admitting: Internal Medicine

## 2014-05-19 DIAGNOSIS — R935 Abnormal findings on diagnostic imaging of other abdominal regions, including retroperitoneum: Secondary | ICD-10-CM

## 2014-05-19 DIAGNOSIS — R932 Abnormal findings on diagnostic imaging of liver and biliary tract: Secondary | ICD-10-CM

## 2014-05-19 MED ORDER — GADOXETATE DISODIUM 0.25 MMOL/ML IV SOLN
8.0000 mL | Freq: Once | INTRAVENOUS | Status: AC | PRN
  Administered 2014-05-19: 8 mL via INTRAVENOUS

## 2014-05-22 NOTE — Telephone Encounter (Signed)
Please see result note and get to her  Thanks

## 2014-07-23 IMAGING — RF DG C-ARM 61-120 MIN
1 series · 2 of 2 positions shown · non-contrast
Comparison: CT scan dated 04/27/2012

CLINICAL DATA: Right tibia fracture.

DG C-ARM 61-120 MIN,RIGHT KNEE - 3 VIEW

[Series 1: run · 2 of 2 slices shown]
[im 1/2]
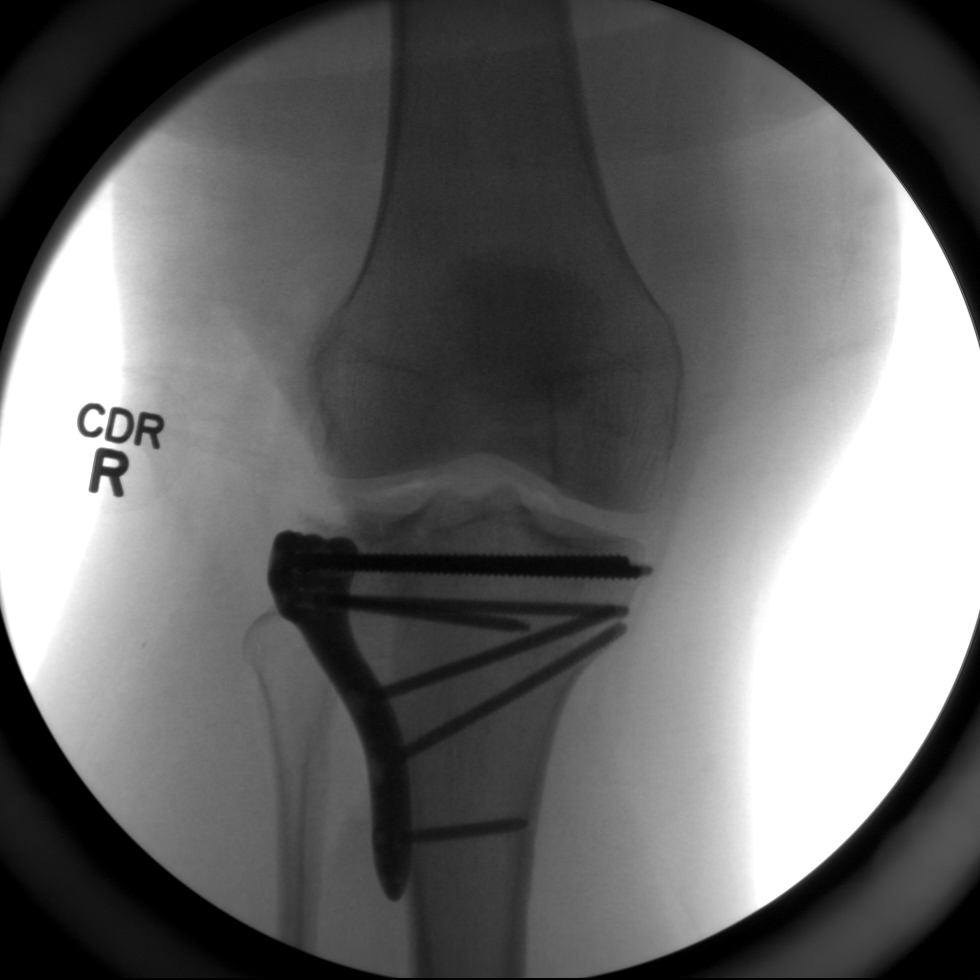
[im 2/2]
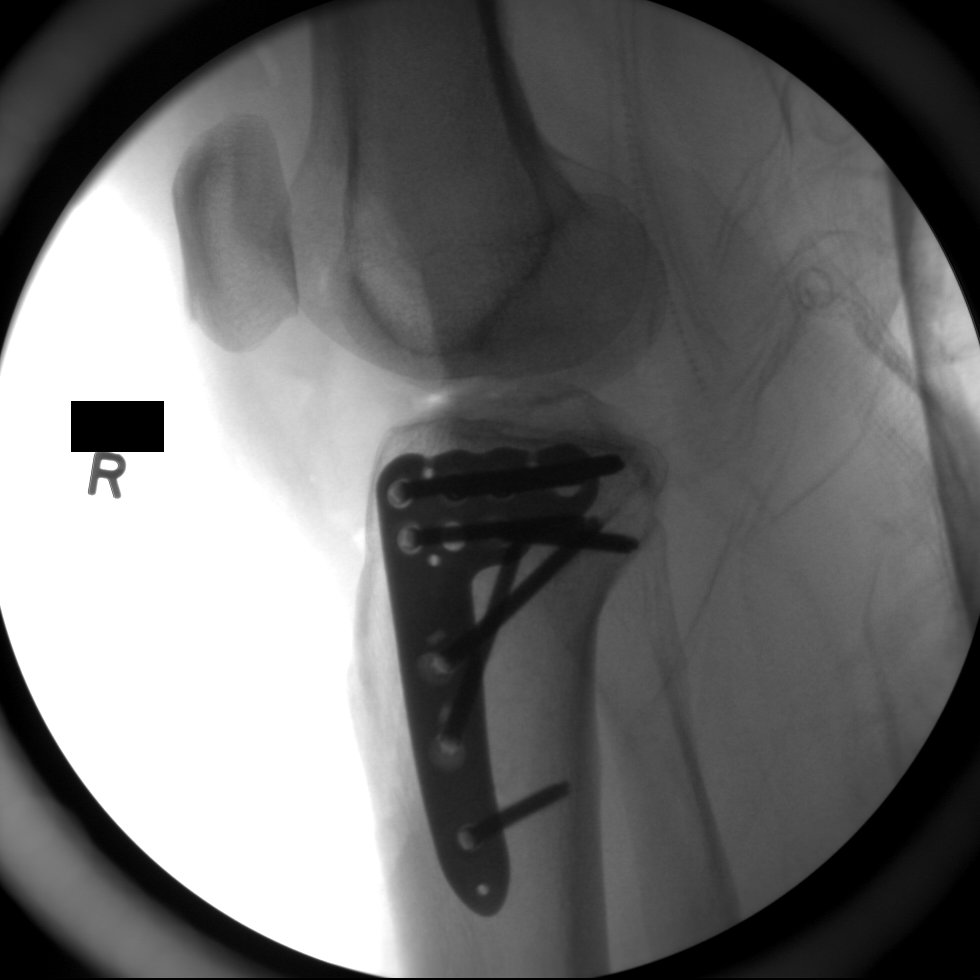

[2 of 2 positions shown; findings below may reference images not displayed]

FINDINGS: AP and lateral C-arm images demonstrate the patient has
undergone open reduction and internal fixation of the impacted
comminuted lateral tibial plateau fracture.  Side plate and
multiple screws are present in the proximal tibia.
IMPRESSION: Open reduction internal fixation of proximal right tibia fracture.

## 2014-10-19 ENCOUNTER — Ambulatory Visit (INDEPENDENT_AMBULATORY_CARE_PROVIDER_SITE_OTHER): Payer: BC Managed Care – PPO | Admitting: Internal Medicine

## 2014-10-19 ENCOUNTER — Encounter: Payer: Self-pay | Admitting: Internal Medicine

## 2014-10-19 VITALS — BP 134/88 | HR 90 | Temp 98.9°F | Ht 59.0 in | Wt 182.0 lb

## 2014-10-19 DIAGNOSIS — M67471 Ganglion, right ankle and foot: Secondary | ICD-10-CM

## 2014-10-19 DIAGNOSIS — R229 Localized swelling, mass and lump, unspecified: Secondary | ICD-10-CM

## 2014-10-19 DIAGNOSIS — IMO0002 Reserved for concepts with insufficient information to code with codable children: Secondary | ICD-10-CM

## 2014-10-19 NOTE — Patient Instructions (Addendum)
Plan referral to   [place] the cystic lesion.  On the right ankle.  As we discussed  Contact us or emergent care  if   Red  Or hot.

## 2014-10-19 NOTE — Progress Notes (Signed)
Chief Complaint  Patient presents with  . Cyst    on ankle    HPI: Patient Elizabeth Tanner  comes in today for SDA for  new problem evaluation. She noticed over the last 1-2 months some swelling in her right medial ankle that is now gotten much larger. No recent trauma but has a history of fracture of her ankle leg in and has had knee surgery on the right. When she "messes with it" there is some pain and numbness that shoots down the medial foot and up her leg. No redness warmth or limitation of motion however her she starting to hit on the area and is concerned that her winter shoes will not be possible because of this large swollen area. Her above injuries in the past were evaluated and treated by [facility] orthopedics.  ROS: See pertinent positives and negatives per HPI.  Past Medical History  Diagnosis Date  . Depression     ? atypical  . Dysmenorrhea   . Gluten intolerance     self reported  . Fracture of fibula     rt  . History of tobacco use   . Knee injury     fall on snow fracture hewitt  . Wears glasses   . Pneumonia 8 12     LLL -clear now  . Hypertension     Family History  Problem Relation Age of Onset  . Alcohol abuse    . Thyroid disease    . Arthritis      Father has a fused spine uncle with DJD  . Heart attack Maternal Uncle 30    Electrical problem also maternal aunt  . Melanoma Cousin   . Other Maternal Aunt     68 pacer.     History   Social History  . Marital Status: Single    Spouse Name: N/A  . Number of Children: N/A  . Years of Education: N/A   Social History Main Topics  . Smoking status: Former Smoker -- 1.50 packs/day for 8 years    Types: Cigarettes  . Smokeless tobacco: Never Used  . Alcohol Use: 0.0 oz/week    0 Standard drinks or equivalent per week     Comment: occ  . Drug Use: No  . Sexual Activity: Not on file     Comment: is smoking a few cigs   Other Topics Concern  . 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                Outpatient Prescriptions Prior to Visit  Medication Sig Dispense Refill  . cyclobenzaprine (FLEXERIL) 5 MG tablet Take 1 tablet (5 mg total) by mouth 3 (three) times daily as needed for muscle spasms. (Patient not taking: Reported on 10/19/2014) 20 tablet 0  . traMADol (ULTRAM) 50 MG tablet Take 1 tablet (50 mg total) by mouth every 8 (eight) hours as needed for moderate pain or severe pain. (Patient not taking: Reported on 10/19/2014) 15 tablet 0   No facility-administered medications prior to visit.     EXAM:  BP 134/88 mmHg  Pulse 90  Temp(Src) 98.9 F (37.2 C)  Ht 4\' 11"  (1.499 m)  Wt 182 lb (82.555 kg)  BMI 36.74 kg/m2  Body mass index is 36.74 kg/(m^2).  GENERAL: vitals reviewed and listed above, alert, oriented, appears well hydrated and in no acute distress MS: moves all extremities  gait within normal limits right ankle no redness there is a 3 cm round cystic soft ballotable area on the medial ankle just in front of the malleolus. Range of motion of ankle seems normal pulses normal no redness or induration or skin changes. PSYCH: pleasant and cooperative, no obvious depression or anxiety  ASSESSMENT AND PLAN:  Discussed the following assessment and plan:  Lump - cystic lesion ankle area ? large ganglion vs joint related  get ortho to see   - Plan: Ambulatory referral to Orthopedic Surgery  Ganglion cyst of right foot - Plan: Ambulatory referral to Orthopedic Surgery  -Patient advised to return or notify health care team  if symptoms worsen ,persist or new concerns arise.  Patient Instructions  Plan referral to   [place] the cystic lesion.  On the right ankle.  As we discussed  Contact us or emergent care  if   Red  Or hot.      Standley Brooking. Betina Puckett M.D.

## 2014-12-14 ENCOUNTER — Encounter: Payer: Self-pay | Admitting: Internal Medicine

## 2014-12-14 ENCOUNTER — Ambulatory Visit (INDEPENDENT_AMBULATORY_CARE_PROVIDER_SITE_OTHER): Payer: BC Managed Care – PPO | Admitting: Internal Medicine

## 2014-12-14 VITALS — BP 110/80 | HR 78 | Temp 98.0°F | Wt 180.0 lb

## 2014-12-14 DIAGNOSIS — R300 Dysuria: Secondary | ICD-10-CM | POA: Diagnosis not present

## 2014-12-14 DIAGNOSIS — Z87442 Personal history of urinary calculi: Secondary | ICD-10-CM | POA: Diagnosis not present

## 2014-12-14 DIAGNOSIS — N390 Urinary tract infection, site not specified: Secondary | ICD-10-CM | POA: Diagnosis not present

## 2014-12-14 LAB — POCT URINALYSIS DIPSTICK
Bilirubin, UA: NEGATIVE
Blood, UA: 3
Glucose, UA: NEGATIVE
Ketones, UA: NEGATIVE
NITRITE UA: NEGATIVE
PH UA: 6
PROTEIN UA: NEGATIVE
Spec Grav, UA: 1.01
UROBILINOGEN UA: 0.2

## 2014-12-14 MED ORDER — SULFAMETHOXAZOLE-TRIMETHOPRIM 800-160 MG PO TABS
1.0000 | ORAL_TABLET | Freq: Two times a day (BID) | ORAL | Status: DC
Start: 2014-12-14 — End: 2015-03-01

## 2014-12-14 NOTE — Progress Notes (Signed)
Chief Complaint  Patient presents with  . Dysuria    HPI: Patient Elizabeth Tanner  comes in today for SDA for  new problem evaluation. Onset a week ago of odor to urine but awaoke this am with dysuria urgency and frequency   chills no fever.  Some lbp no flank pain  No gross hematuria  Has hx of stones none sx for years .  lmp doesn't have menses  ROS: See pertinent positives and negatives per HPI.  Past Medical History  Diagnosis Date  . Depression     ? atypical  . Dysmenorrhea   . Gluten intolerance     self reported  . Fracture of fibula     rt  . History of tobacco use   . Knee injury     fall on snow fracture hewitt  . Wears glasses   . Pneumonia 8 12     LLL -clear now  . Hypertension   . History of nephrolithiasis     Family History  Problem Relation Age of Onset  . Alcohol abuse    . Thyroid disease    . Arthritis      Father has a fused spine uncle with DJD  . Heart attack Maternal Uncle 30    Electrical problem also maternal aunt  . Melanoma Cousin   . Other Maternal Aunt     68 pacer.     Social History   Social History  . Marital Status: Single    Spouse Name: N/A  . Number of Children: N/A  . Years of Education: N/A   Social History Main Topics  . Smoking status: Former Smoker -- 1.50 packs/day for 8 years    Types: Cigarettes  . Smokeless tobacco: Never Used  . Alcohol Use: 0.0 oz/week    0 Standard drinks or equivalent per week     Comment: occ  . Drug Use: No  . Sexual Activity: Not Asked     Comment: is smoking a few cigs   Other Topics Concern  . 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                Outpatient Prescriptions Prior to Visit  Medication Sig Dispense Refill  . cyclobenzaprine (FLEXERIL) 5 MG tablet Take 1 tablet (5 mg total) by mouth 3 (three)  times daily as needed for muscle spasms. (Patient not taking: Reported on 12/14/2014) 20 tablet 0  . traMADol (ULTRAM) 50 MG tablet Take 1 tablet (50 mg total) by mouth every 8 (eight) hours as needed for moderate pain or severe pain. (Patient not taking: Reported on 12/14/2014) 15 tablet 0   No facility-administered medications prior to visit.     EXAM:  BP 110/80 mmHg  Pulse 78  Temp(Src) 98 F (36.7 C) (Oral)  Wt 180 lb (81.647 kg)  Body mass index is 36.34 kg/(m^2).  GENERAL: vitals reviewed and listed above, alert, oriented, appears well hydrated and in no acute distress HEENT: atraumatic, conjunctiva  clear, no obvious abnormalities on inspection of external nose and ears CV: HRRR, no clubbing cyanosis or  peripheral edema nl cap refill  Abdomen:  Sof,t normal bowel sounds without hepatosplenomegaly, no guarding rebound or masses  Ticklish nosignificant  CVA tenderness  mild lbp  Skin nl cap refill  PSYCH: pleasant and cooperative, no obvious depression or anxiety ua 3+bl 3+ leuk ASSESSMENT AND PLAN:  Discussed the following assessment and plan:  Dysuria - Plan: POCT urinalysis dipstick, Culture, Urine  UTI (lower urinary tract infection)  History of nephrolithiasis rx   Expectant management.  -Patient advised to return or notify health care team  if symptoms worsen ,persist or new concerns arise.  Patient Instructions  You have  UTI.  Will le t you know  Culture results   When back .   Standley Brooking. Panosh M.D.

## 2014-12-14 NOTE — Patient Instructions (Signed)
You have  UTI.  Will le t you know  Culture results   When back .

## 2014-12-14 NOTE — Progress Notes (Signed)
Pre visit review using our clinic review tool, if applicable. No additional management support is needed unless otherwise documented below in the visit note. 

## 2014-12-17 LAB — URINE CULTURE: Colony Count: >=100000

## 2015-01-03 ENCOUNTER — Ambulatory Visit (INDEPENDENT_AMBULATORY_CARE_PROVIDER_SITE_OTHER): Payer: BC Managed Care – PPO | Admitting: Internal Medicine

## 2015-01-03 VITALS — BP 132/96 | Temp 99.9°F | Wt 177.7 lb

## 2015-01-03 DIAGNOSIS — N12 Tubulo-interstitial nephritis, not specified as acute or chronic: Secondary | ICD-10-CM | POA: Diagnosis not present

## 2015-01-03 DIAGNOSIS — Z87442 Personal history of urinary calculi: Secondary | ICD-10-CM | POA: Diagnosis not present

## 2015-01-03 DIAGNOSIS — N390 Urinary tract infection, site not specified: Secondary | ICD-10-CM

## 2015-01-03 LAB — POCT URINALYSIS DIPSTICK
Bilirubin, UA: NEGATIVE
Blood, UA: 1
GLUCOSE UA: NEGATIVE
Ketones, UA: 5
NITRITE UA: NEGATIVE
Protein, UA: 1
Spec Grav, UA: 1.025
UROBILINOGEN UA: 0.2
pH, UA: 6

## 2015-01-03 NOTE — Progress Notes (Signed)
No chief complaint on file.   HPI: Elizabeth Tanner  44 y.o. comes in fu uti sx from September   Had proteus  rx with 5 days of septra  ? If low grade fever beginning  per se but   No resolution some low back pain  Feels achy  No blood in urine today . No stone sx  Today   No v d   ROS: See pertinent positives and negatives per HPI. Hx utis cysts  Past Medical History  Diagnosis Date  . Depression     ? atypical  . Dysmenorrhea   . Gluten intolerance     self reported  . Fracture of fibula     rt  . History of tobacco use   . Knee injury     fall on snow fracture hewitt  . Wears glasses   . Pneumonia 8 12     LLL -clear now  . Hypertension   . History of nephrolithiasis     Family History  Problem Relation Age of Onset  . Alcohol abuse    . Thyroid disease    . Arthritis      Father has a fused spine uncle with DJD  . Heart attack Maternal Uncle 30    Electrical problem also maternal aunt  . Melanoma Cousin   . Other Maternal Aunt     68 pacer.     Social History   Social History  . Marital Status: Single    Spouse Name: N/A  . Number of Children: N/A  . Years of Education: N/A   Social History Main Topics  . Smoking status: Former Smoker -- 1.50 packs/day for 8 years    Types: Cigarettes  . Smokeless tobacco: Never Used  . Alcohol Use: 0.0 oz/week    0 Standard drinks or equivalent per week     Comment: occ  . Drug Use: No  . Sexual Activity: Not on file     Comment: is smoking a few cigs   Other Topics Concern  . 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                Outpatient Prescriptions Prior to Visit  Medication Sig Dispense Refill  . cyclobenzaprine (FLEXERIL) 5 MG tablet Take 1 tablet (5 mg total) by mouth 3 (three) times daily as needed for muscle spasms. (Patient  not taking: Reported on 12/14/2014) 20 tablet 0  . sulfamethoxazole-trimethoprim (BACTRIM DS,SEPTRA DS) 800-160 MG tablet Take 1 tablet by mouth 2 (two) times daily. 10 tablet 0  . traMADol (ULTRAM) 50 MG tablet Take 1 tablet (50 mg total) by mouth every 8 (eight) hours as needed for moderate pain or severe pain. (Patient not taking: Reported on 12/14/2014) 15 tablet 0   No facility-administered medications prior to visit.     EXAM:  BP 132/96 mmHg  Temp(Src) 99.9 F (37.7 C) (Oral)  Wt 177 lb 11.2 oz (80.604 kg)  Body mass index is 35.87 kg/(m^2).  GENERAL: vitals reviewed and listed above, alert, oriented, appears well hydrated and in no acute distress HEENT: atraumatic, conjunctiva  clear, no obvious abnormalities on inspection of external nose and ears  NECK: no obvious masses on inspection palpation  LUNGS: clear to auscultation bilaterally, no wheezes, rales or rhonchi, good air movement Abdomen:  Sof,t normal bowel sounds without hepatosplenomegaly, no guarding rebound or masses mild tenderness no CVA tenderness   CV: HRRR, no clubbing cyanosis or  peripheral edema nl cap refill  MS: moves all extremities without noticeable focal  abnormality PSYCH: pleasant and cooperative, no obvious depression or anxiety  ASSESSMENT AND PLAN:  Discussed the following assessment and plan:  Urinary tract infection, site not specified - Plan: Urine culture, POC Urinalysis Dipstick  Pyelonephritis possible  - Plan: POC Urinalysis Dipstick  Recurrent UTI - Plan: POC Urinalysis Dipstick  History of nephrolithiasis - Plan: POC Urinalysis Dipstick Concern about  Early pyelo ongoing infectsoin atypical germ  Proteus   rx cipro and plan fu  If not better otherwise still get ua after rx  -Patient advised to return or notify health care team  if symptoms worsen ,persist or new concerns arise.  Patient Instructions  Hand sritten instr u cx cipro 50 bid for 10 day and fu 2-3 weeks  Or if ok urine   ua at that time     Standley Brooking. Panosh M.D.

## 2015-01-03 NOTE — Patient Instructions (Signed)
Hand sritten instr u cx cipro 50 bid for 10 day and fu 2-3 weeks  Or if ok urine  ua at that time

## 2015-01-06 LAB — URINE CULTURE: Colony Count: >=100000

## 2015-01-12 ENCOUNTER — Encounter: Payer: Self-pay | Admitting: Internal Medicine

## 2015-03-01 ENCOUNTER — Other Ambulatory Visit: Payer: Self-pay | Admitting: *Deleted

## 2015-03-01 ENCOUNTER — Ambulatory Visit (INDEPENDENT_AMBULATORY_CARE_PROVIDER_SITE_OTHER): Payer: BC Managed Care – PPO | Admitting: Family Medicine

## 2015-03-01 ENCOUNTER — Encounter: Payer: Self-pay | Admitting: Family Medicine

## 2015-03-01 VITALS — BP 130/90 | Temp 99.2°F | Wt 180.0 lb

## 2015-03-01 DIAGNOSIS — N390 Urinary tract infection, site not specified: Secondary | ICD-10-CM

## 2015-03-01 DIAGNOSIS — R309 Painful micturition, unspecified: Secondary | ICD-10-CM | POA: Diagnosis not present

## 2015-03-01 LAB — POCT URINALYSIS DIPSTICK
BILIRUBIN UA: NEGATIVE
GLUCOSE UA: NEGATIVE
Ketones, UA: NEGATIVE
LEUKOCYTES UA: NEGATIVE
NITRITE UA: NEGATIVE
Protein, UA: NEGATIVE
Spec Grav, UA: <=1.005
UROBILINOGEN UA: 0.2
pH, UA: 8

## 2015-03-01 MED ORDER — CIPROFLOXACIN HCL 500 MG PO TABS: 500.0000 mg | ORAL_TABLET | Freq: Two times a day (BID) | ORAL | Status: DC

## 2015-03-01 NOTE — Progress Notes (Signed)
   Subjective:    Patient ID: Elizabeth Tanner, female    DOB: 06/14/1970, 44 y.o.   MRN: [id number]  HPI Here for recurrent urinary symptoms including burning, urgency, and blood in the urine. No nausea or vomiting. She was diagnosed with a Proteus UTI twice recently, the first time treated with Bactrim and te second time with Cipro. The second time the culture proved it to be sensitive to Cipro. Her symptoms resolved completely and she felt fine until 2 days ago when the same picture developed. She drinks plenty of water.    Review of Systems  Constitutional: Positive for fever.  HENT: Negative.   Respiratory: Negative.   Cardiovascular: Negative.   Gastrointestinal: Negative.   Genitourinary: Positive for dysuria, urgency, frequency and hematuria. Negative for flank pain.       Objective:   Physical Exam  Constitutional: She appears well-developed and well-nourished.  Pulmonary/Chest: Effort normal and breath sounds normal.  Abdominal: Soft. Bowel sounds are normal. She exhibits no distension and no mass. There is no tenderness. There is no rebound and no guarding.          Assessment & Plan:  Recurrent UTI. Treat with Cipro. Get another culture.

## 2015-03-03 LAB — URINE CULTURE: Colony Count: 30000

## 2015-03-28 ENCOUNTER — Encounter: Payer: Self-pay | Admitting: Internal Medicine

## 2015-03-28 ENCOUNTER — Ambulatory Visit (INDEPENDENT_AMBULATORY_CARE_PROVIDER_SITE_OTHER): Payer: BC Managed Care – PPO | Admitting: Internal Medicine

## 2015-03-28 VITALS — BP 136/94 | Temp 98.7°F | Wt 180.9 lb

## 2015-03-28 DIAGNOSIS — N3001 Acute cystitis with hematuria: Secondary | ICD-10-CM | POA: Diagnosis not present

## 2015-03-28 DIAGNOSIS — Z87442 Personal history of urinary calculi: Secondary | ICD-10-CM | POA: Diagnosis not present

## 2015-03-28 DIAGNOSIS — N390 Urinary tract infection, site not specified: Secondary | ICD-10-CM

## 2015-03-28 LAB — POCT URINALYSIS DIPSTICK
BILIRUBIN UA: NEGATIVE
Blood, UA: 3
Glucose, UA: NEGATIVE
KETONES UA: NEGATIVE
LEUKOCYTES UA: NEGATIVE
Nitrite, UA: NEGATIVE
PH UA: 7.5
PROTEIN UA: NEGATIVE
Spec Grav, UA: 1.015
Urobilinogen, UA: 0.2

## 2015-03-28 MED ORDER — CIPROFLOXACIN HCL 500 MG PO TABS: 500.0000 mg | ORAL_TABLET | Freq: Two times a day (BID) | ORAL | Status: DC

## 2015-03-28 NOTE — Progress Notes (Signed)
Pre visit review using our clinic review tool, if applicable. No additional management support is needed unless otherwise documented below in the visit note.  Chief Complaint  Patient presents with  . Dysuria  . Abdominal Pain  . Urinary Frequency  . Urinary Urgency    HPI: Patient Elizabeth Tanner  comes in today for SDA for  new problem evaluation. Pt has hx of recurrent utis .  This time just beginning 3 days of lower abd pain dark tea colored urine and dysuria  Without fever chills . rx in dec with 10 days of cipro and got all better  ( cx mult spec)    Asks  If no periods could be related  Hx of renal stone in past but no urology evaluation.  Feels empties bladder ocass constipation but not a lot of bowel dysfunction.  ROS: See pertinent positives and negatives per HPI.  Past Medical History  Diagnosis Date  . Depression     ? atypical  . Dysmenorrhea   . Gluten intolerance     self reported  . Fracture of fibula     rt  . History of tobacco use   . Knee injury     fall on snow fracture hewitt  . Wears glasses   . Pneumonia 8 12     LLL -clear now  . Hypertension   . History of nephrolithiasis     Family History  Problem Relation Age of Onset  . Alcohol abuse    . Thyroid disease    . Arthritis      Father has a fused spine uncle with DJD  . Heart attack Maternal Uncle 30    Electrical problem also maternal aunt  . Melanoma Cousin   . Other Maternal Aunt     68 pacer.     Social History   Social History  . Marital Status: Single    Spouse Name: N/A  . Number of Children: N/A  . Years of Education: N/A   Social History Main Topics  . Smoking status: Former Smoker -- 1.50 packs/day for 8 years    Types: Cigarettes  . Smokeless tobacco: Never Used  . Alcohol Use: 0.0 oz/week    0 Standard drinks or equivalent per week     Comment: occ  . Drug Use: No  . Sexual Activity: Not Asked     Comment: is smoking a few cigs   Other Topics Concern  .  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                Outpatient Prescriptions Prior to Visit  Medication Sig Dispense Refill  . ciprofloxacin (CIPRO) 500 MG tablet Take 1 tablet (500 mg total) by mouth 2 (two) times daily. 20 tablet 0   No facility-administered medications prior to visit.     EXAM:  BP 136/94 mmHg  Temp(Src) 98.7 F (37.1 C) (Oral)  Wt 180 lb 14.4 oz (82.056 kg)  Body mass index is 36.52 kg/(m^2).  GENERAL: vitals reviewed and listed above, alert, oriented, appears well hydrated and in no acute distress HEENT: atraumatic, conjunctiva  clear, no obvious abnormalities on inspection of external nose and ears OP : no lesion edema or exudate  No cva  tenderness  PSYCH: pleasant and cooperative, no obvious depression or anxiety UA 3+ blood - leuk on ds  ASSESSMENT AND PLAN:  Discussed the following assessment and plan:  Acute cystitis with hematuria - Plan: Ambulatory referral to Urology  Recurrent UTI - Plan: POC Urinalysis Dipstick, Culture, Urine, Ambulatory referral to Urology  History of nephrolithiasis Disc strategies for recurrent utis   I advise  Referral for more evaluation urology  As she has had proteus  In past and frequent recurrant sx  And hematuria recently . In interim cipro 500 bid for 7 days   -Patient advised to return or notify health care team  if symptoms worsen ,persist or new concerns arise.  Patient Instructions   urology  Consult about these recurrent utis .  Consideration suppressive therapy.  It is possible that estrogen topical could help but would get eval by urology first .    7 days of antibiotic   An will be notified of referral .   Standley Brooking. Panosh M.D.

## 2015-03-28 NOTE — Patient Instructions (Addendum)
urology  Consult about these recurrent utis .  Consideration suppressive therapy.  It is possible that estrogen topical could help but would get eval by urology first .    7 days of antibiotic   An will be notified of referral .

## 2015-03-30 LAB — URINE CULTURE: Colony Count: >=100000

## 2015-03-31 NOTE — Progress Notes (Signed)
Quick Note:  Tell pt urine culture showed multiple bacteria but not predominant germ. Cannot tell if she has UTI. But clinically she does . Proceed with medication and referral( already done) To urology  Take medication. ______

## 2015-04-03 ENCOUNTER — Encounter: Payer: Self-pay | Admitting: Internal Medicine

## 2015-04-04 ENCOUNTER — Encounter: Payer: Self-pay | Admitting: Family Medicine

## 2015-04-04 ENCOUNTER — Telehealth: Payer: Self-pay | Admitting: Internal Medicine

## 2015-04-04 ENCOUNTER — Other Ambulatory Visit (HOSPITAL_COMMUNITY)
Admission: RE | Admit: 2015-04-04 | Discharge: 2015-04-04 | Disposition: A | Discharge status: Home / Self Care | Payer: BC Managed Care – PPO | Source: Ambulatory Visit | Attending: Family Medicine | Admitting: Family Medicine

## 2015-04-04 ENCOUNTER — Ambulatory Visit (INDEPENDENT_AMBULATORY_CARE_PROVIDER_SITE_OTHER): Payer: BC Managed Care – PPO | Admitting: Family Medicine

## 2015-04-04 VITALS — BP 128/82 | HR 82 | Temp 98.7°F | Ht 59.0 in | Wt 180.9 lb

## 2015-04-04 DIAGNOSIS — R102 Pelvic and perineal pain: Secondary | ICD-10-CM

## 2015-04-04 DIAGNOSIS — Z113 Encounter for screening for infections with a predominantly sexual mode of transmission: Secondary | ICD-10-CM | POA: Insufficient documentation

## 2015-04-04 DIAGNOSIS — R109 Unspecified abdominal pain: Secondary | ICD-10-CM | POA: Diagnosis not present

## 2015-04-04 DIAGNOSIS — R300 Dysuria: Secondary | ICD-10-CM | POA: Diagnosis not present

## 2015-04-04 DIAGNOSIS — N760 Acute vaginitis: Secondary | ICD-10-CM | POA: Diagnosis present

## 2015-04-04 LAB — COMPREHENSIVE METABOLIC PANEL
ALT: 28 U/L (ref 0–35)
AST: 19 U/L (ref 0–37)
Albumin: 4.9 g/dL (ref 3.5–5.2)
Alkaline Phosphatase: 74 U/L (ref 39–117)
BUN: 16 mg/dL (ref 6–23)
CALCIUM: 9.8 mg/dL (ref 8.4–10.5)
CHLORIDE: 104 meq/L (ref 96–112)
CO2: 30 meq/L (ref 19–32)
Creatinine, Ser: 0.69 mg/dL (ref 0.40–1.20)
GFR: 97.7 mL/min (ref >60.00)
Glucose, Bld: 99 mg/dL (ref 70–99)
POTASSIUM: 3.9 meq/L (ref 3.5–5.1)
Sodium: 143 mEq/L (ref 135–145)
Total Bilirubin: 0.6 mg/dL (ref 0.2–1.2)
Total Protein: 7.4 g/dL (ref 6.0–8.3)

## 2015-04-04 LAB — POCT URINALYSIS DIPSTICK
BILIRUBIN UA: NEGATIVE
Blood, UA: NEGATIVE
GLUCOSE UA: NEGATIVE
KETONES UA: NEGATIVE
Leukocytes, UA: NEGATIVE
Nitrite, UA: NEGATIVE
Protein, UA: NEGATIVE
UROBILINOGEN UA: 0.2
pH, UA: 6

## 2015-04-04 LAB — CBC WITH DIFFERENTIAL/PLATELET
BASOS PCT: 0.6 % (ref 0.0–3.0)
Basophils Absolute: 0 10*3/uL (ref 0.0–0.1)
EOS ABS: 0.1 10*3/uL (ref 0.0–0.7)
EOS PCT: 2 % (ref 0.0–5.0)
HEMATOCRIT: 44 % (ref 36.0–46.0)
Hemoglobin: 14.6 g/dL (ref 12.0–15.0)
Lymphocytes Relative: 37.8 % (ref 12.0–46.0)
Lymphs Abs: 2.6 10*3/uL (ref 0.7–4.0)
MCHC: 33.3 g/dL (ref 30.0–36.0)
MCV: 90.8 fl (ref 78.0–100.0)
MONOS PCT: 5.6 % (ref 3.0–12.0)
Monocytes Absolute: 0.4 10*3/uL (ref 0.1–1.0)
NEUTROS ABS: 3.7 10*3/uL (ref 1.4–7.7)
Neutrophils Relative %: 54 % (ref 43.0–77.0)
PLATELETS: 177 10*3/uL (ref 150.0–400.0)
RBC: 4.84 Mil/uL (ref 3.87–5.11)
RDW: 12.7 % (ref 11.5–15.5)
WBC: 6.9 10*3/uL (ref 4.0–10.5)

## 2015-04-04 MED ORDER — TRAMADOL HCL 50 MG PO TABS: 50.0000 mg | ORAL_TABLET | Freq: Two times a day (BID) | ORAL | Status: DC | PRN

## 2015-04-04 NOTE — Progress Notes (Signed)
HPI:  Elizabeth Tanner is a 42 you F with a PMH significant for nephrolithiasis, dysmenorrhea, depression, self reported gluten intolerance, recurrent UTIs and constipation here for an acute visit for:  Dysuria: -intermittent the last few months per her report but worse the last several weeks -just saw pcp for this several days ago and was tx for possible UTI and was referred to urologist -udip 12/16 with 3+ blood, o/w neg and cult mbm; udip 03/28/15 3+ bld o/w neg and culture again with mbm -symptoms: intermittent dysuria, bilat lower abd pain in the past - severe at times, now no abd pain the last few days, but reports dysuria, pain and pressure in vaginal area, constipation (chronic) and wants pain medication - reports can take tramadol -report OTC options do not help -denies:fevers, malaise, chills, hematuria, vaginal discharge, diarrhea, hematochezia, melena -reports has eval with urologist in a few days  -report no pelvic or pap exams in many years and not sexually active, refuses pap and pelvic initially  ROS: See pertinent positives and negatives per HPI.  Past Medical History  Diagnosis Date  . Depression     ? atypical  . Dysmenorrhea   . Gluten intolerance     self reported  . Fracture of fibula     rt  . History of tobacco use   . Knee injury     fall on snow fracture hewitt  . Wears glasses   . Pneumonia 8 12     LLL -clear now  . Hypertension   . History of nephrolithiasis     Past Surgical History  Procedure Laterality Date  . Breast reduction surgery  2001  . Mouth surgery    . Orif tibia plateau Right 05/05/2012    Procedure: OPEN REDUCTION INTERNAL FIXATION TIBIAL PLATEAU;  Surgeon: Wylene Simmer, MD;  Location: [facility];  Service: Orthopedics;  Laterality: Right;    Family History  Problem Relation Age of Onset  . Alcohol abuse    . Thyroid disease    . Arthritis      Father has a fused spine uncle with DJD  . Heart attack Maternal  Uncle 30    Electrical problem also maternal aunt  . Melanoma Cousin   . Other Maternal Aunt     68 pacer.     Social History   Social History  . Marital Status: Single    Spouse Name: N/A  . Number of Children: N/A  . Years of Education: N/A   Social History Main Topics  . Smoking status: Former Smoker -- 1.50 packs/day for 8 years    Types: Cigarettes  . Smokeless tobacco: Never Used  . Alcohol Use: 0.0 oz/week    0 Standard drinks or equivalent per week     Comment: occ  . Drug Use: No  . Sexual Activity: Not Asked     Comment: is smoking a few cigs   Other Topics Concern  . 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                 Current outpatient prescriptions:  .  traMADol (ULTRAM) 50 MG tablet, Take 1 tablet (50 mg total) by mouth every 12 (twelve) hours as needed for severe pain.  All further refills must come from Dr. Regis Bill or your specialist., Disp: 15 tablet, Rfl: 0  EXAM:  Filed Vitals:   04/04/15 1354  BP: 128/82  Pulse: 82  Temp: 98.7 F (37.1 C)    Body mass index is 36.52 kg/(m^2).  GENERAL: vitals reviewed and listed above, alert, oriented, appears well hydrated and in no acute distress  HEENT: atraumatic, conjunttiva clear, no obvious abnormalities on inspection of external nose and ears  NECK: no obvious masses on inspection  LUNGS: clear to auscultation bilaterally, no wheezes, rales or rhonchi, good air movement  CV: HRRR, no peripheral edema  ABD:BS+, soft, NTTP no CVA TTP  GU: very limited exam as pt very uncomfortable and refused further insertion of speculum, cervix not visualized, white homogenous discharge, bimanual exam refused  MS: moves all extremities without noticeable abnormality  PSYCH: pleasant and cooperative, no obvious depression or  anxiety  ASSESSMENT AND PLAN:   Discussed the following assessment and plan:  Dysuria - Plan: CBC with Differential, CMP  Abdominal pain, unspecified abdominal location  Vaginal pain  -agree with referral to urology and eval for other causes of dysuria, pain and hematuria - passing stone in lower urinary tract, IC, etc - udip ok today -labs cbc, cmp, vagina swab for vaginal infections given area of pain - she refused pap, pelvic full speculum exam  -limited supply pain medication after discussion risks and advised all further refills must come from PCP or specialist -had abd [place] and MR abd in the last 1 year but may need re-imaging if persistent pain - ? TVUS and gyn eval if persistent pain in vaginal area if symptoms not explained by urology eval -Patient advised to return or notify a doctor immediately if symptoms worsen or persist or new concerns arise.  Patient Instructions  BEFORE YOU LEAVE: -labs -schedule follow up with Dr. Regis Bill in 2 weeks  Keep appointment with urologist  -We have ordered labs or studies at this visit. It can take up to 1-2 weeks for results and processing. We will contact you with instructions IF your results are abnormal. Normal results will be released to your [facility]. If you have not heard from [place] or can not find your results in [facility] in 2 weeks please contact our office.            Colin Benton R.

## 2015-04-04 NOTE — Telephone Encounter (Signed)
Patient Name: Elizabeth Tanner  DOB: [birth year]    Initial Comment Caller states she has pain when she urinates, she was on the antibiotics on the 12th for this, not getting better.   Nurse Assessment  Nurse: Raphael Gibney, RN, Vanita Ingles Date/Time (Eastern Time): 04/04/2015 11:15:12 AM  Confirm and document reason for call. If symptomatic, describe symptoms. You must click the next button to save text entered. ---Caller states she took her last antibiotic this am for UTI. She took cipro and she has pain all the time. pain in lower abd. Pain is severe.  Has the patient traveled out of the country within the last 30 days? ---Not Applicable  Does the patient have any new or worsening symptoms? ---Yes  Will a triage be completed? ---Yes  Related visit to physician within the last 2 weeks? ---Yes  Does the PT have any chronic conditions? (i.e. diabetes, asthma, etc.) ---No  Did the patient indicate they were pregnant? ---No  Is this a behavioral health or substance abuse call? ---No     Guidelines    Guideline Title Affirmed Question Affirmed Notes  Urinary Tract Infection on Antibiotic Follow-up Call - Female [1] SEVERE pain (e.g., excruciating) AND [2] no improvement 2 hours after pain medications    Final Disposition User   See Physician within 4 Hours (or PCP triage) Raphael Gibney, RN, Vanita Ingles    Comments  Appt scheduled for 2 pm 04/04/2015 with Dr. Colin Benton   Referrals  REFERRED TO PCP OFFICE   Disagree/Comply: Comply

## 2015-04-04 NOTE — Patient Instructions (Signed)
BEFORE YOU LEAVE: -labs -schedule follow up with Dr. Regis Bill in 2 weeks  Keep appointment with urologist  -We have ordered labs or studies at this visit. It can take up to 1-2 weeks for results and processing. We will contact you with instructions IF your results are abnormal. Normal results will be released to your [facility]. If you have not heard from [place] or can not find your results in [facility] in 2 weeks please contact our office.

## 2015-04-04 NOTE — Telephone Encounter (Signed)
Patient here now to see Dr Maudie Mercury.

## 2015-04-04 NOTE — Telephone Encounter (Signed)
FYI - patient has an appointment

## 2015-04-04 NOTE — Progress Notes (Signed)
Pre visit review using our clinic review tool, if applicable. No additional management support is needed unless otherwise documented below in the visit note. 

## 2015-04-04 NOTE — Addendum Note (Signed)
Addended by: Agnes Lawrence on: 04/04/2015 02:55 PM   Modules accepted: Orders

## 2015-04-05 LAB — CERVICOVAGINAL ANCILLARY ONLY
Chlamydia: NEGATIVE
Neisseria Gonorrhea: NEGATIVE
Trichomonas: NEGATIVE

## 2015-04-08 LAB — CERVICOVAGINAL ANCILLARY ONLY
Bacterial vaginitis: NEGATIVE
CANDIDA VAGINITIS: NEGATIVE

## 2015-04-19 ENCOUNTER — Encounter: Payer: Self-pay | Admitting: Internal Medicine

## 2015-04-19 ENCOUNTER — Ambulatory Visit (INDEPENDENT_AMBULATORY_CARE_PROVIDER_SITE_OTHER): Payer: BC Managed Care – PPO | Admitting: Internal Medicine

## 2015-04-19 VITALS — BP 144/90 | Temp 98.6°F | Wt 181.4 lb

## 2015-04-19 DIAGNOSIS — N912 Amenorrhea, unspecified: Secondary | ICD-10-CM | POA: Diagnosis not present

## 2015-04-19 DIAGNOSIS — N390 Urinary tract infection, site not specified: Secondary | ICD-10-CM | POA: Diagnosis not present

## 2015-04-19 DIAGNOSIS — Z87442 Personal history of urinary calculi: Secondary | ICD-10-CM

## 2015-04-19 LAB — LUTEINIZING HORMONE: LH: 34.12 m[IU]/mL

## 2015-04-19 LAB — FOLLICLE STIMULATING HORMONE: FSH: 72.1 m[IU]/mL

## 2015-04-19 NOTE — Patient Instructions (Signed)
You may be menopausal   ? If vaginal estrogen could help .for recurrent uti  . Ask urology also .  Will notify you  of labs when available.

## 2015-04-19 NOTE — Progress Notes (Signed)
Pre visit review using our clinic review tool, if applicable. No additional management support is needed unless otherwise documented below in the visit note.   Chief Complaint  Patient presents with  . Follow-up    recurrent utis and renal stones    HPI:  Elizabeth Tanner 45 y.o. comes in for follow-up of above problem. Since her last visit she's doing a lot better still minor symptoms. She did see urology Dr. Bess Harvest who did a CT stones noted nonobstructive and is undergoing a metabolic workup. Her PTH uric acid and chemistries were normal 24 hour urine is pending.  Was didn't get much of an answer about recurrent UTIs but her postvoid residual was normal see urology notes.  She hasn't had a period since 2014 except one time occasional hot flashes mom had menopause at age 79   although was unable to have a pelvic exam through Dr. Maudie Mercury did have evaluation through urology and imaging studies no unusual abnormalities.She is not pregnant and doesn't think she has any structural problem in the pelvic area.  ROS: See pertinent positives and negatives per HPI.  Past Medical History  Diagnosis Date  . Depression     ? atypical  . Dysmenorrhea   . Gluten intolerance     self reported  . Fracture of fibula     rt  . History of tobacco use   . Knee injury     fall on snow fracture hewitt  . Wears glasses   . Pneumonia 8 12     LLL -clear now  . Hypertension   . History of nephrolithiasis     Family History  Problem Relation Age of Onset  . Alcohol abuse    . Thyroid disease    . Arthritis      Father has a fused spine uncle with DJD  . Heart attack Maternal Uncle 30    Electrical problem also maternal aunt  . Melanoma Cousin   . Other Maternal Aunt     68 pacer.     Social History   Social History  . Marital Status: Single    Spouse Name: N/A  . Number of Children: N/A  . Years of Education: N/A   Social History Main Topics  . Smoking status: Former Smoker -- 1.50  packs/day for 8 years    Types: Cigarettes  . Smokeless tobacco: Never Used  . Alcohol Use: 0.0 oz/week    0 Standard drinks or equivalent per week     Comment: occ  . Drug Use: No  . Sexual Activity: Not Asked     Comment: is smoking a few cigs   Other Topics Concern  . 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                Outpatient Prescriptions Prior to Visit  Medication Sig Dispense Refill  . traMADol (ULTRAM) 50 MG tablet Take 1 tablet (50 mg total) by mouth every 12 (twelve) hours as needed for severe pain. All further refills must come from Dr. Regis Bill or your specialist. 15 tablet 0   No facility-administered medications prior to visit.     EXAM:  BP 144/90 mmHg  Temp(Src) 98.6 F (37 C) (Oral)  Wt 181 lb  6.4 oz (82.283 kg)  Body mass index is 36.62 kg/(m^2).  GENERAL: vitals reviewed and listed above, alert, oriented, appears well hydrated and in no acute distress MS: moves all extremities without noticeable focal  abnormality PSYCH: pleasant and cooperative, no obvious depression or anxiety  reviewed her urology notes  ASSESSMENT AND PLAN:  Discussed the following assessment and plan:  Amenorrhea - poss menopausal was on dep ion past but not for years  - Plan: Follicle Stimulating Hormone, Luteinizing Hormone, Prolactin  Recurrent UTI  History of nephrolithiasis - under eval  nl pth and uric acid 24 hour urine pending  feels blood pressure was elevated cousin was stressed and getting here is normal at home.  Follow-up with urology questions clarified and discussed. Uncertain if is a possible status puts her at risk of her recurrent UTIs. Reassured her that her UTIs are not related to her renal stones.  -Patient advised to return or notify health care team  if symptoms worsen ,persist or new  concerns arise.  Patient Instructions  You may be menopausal   ? If vaginal estrogen could help .for recurrent uti  . Ask urology also .  Will notify you  of labs when available.     Standley Brooking. Marquies Wanat M.D.

## 2015-04-20 LAB — PROLACTIN: PROLACTIN: 4.4 ng/mL

## 2015-05-28 ENCOUNTER — Ambulatory Visit (INDEPENDENT_AMBULATORY_CARE_PROVIDER_SITE_OTHER): Payer: BC Managed Care – PPO | Admitting: Adult Health

## 2015-05-28 ENCOUNTER — Encounter: Payer: Self-pay | Admitting: Adult Health

## 2015-05-28 VITALS — BP 124/88 | Temp 98.9°F | Ht 59.0 in | Wt 181.4 lb

## 2015-05-28 DIAGNOSIS — M62830 Muscle spasm of back: Secondary | ICD-10-CM

## 2015-05-28 DIAGNOSIS — H6092 Unspecified otitis externa, left ear: Secondary | ICD-10-CM

## 2015-05-28 MED ORDER — CYCLOBENZAPRINE HCL 10 MG PO TABS: 10.0000 mg | ORAL_TABLET | Freq: Three times a day (TID) | ORAL | Status: DC | PRN

## 2015-05-28 MED ORDER — NEOMYCIN-POLYMYXIN-HC 3.5-10000-1 OT SOLN: 3.0000 [drp] | Freq: Four times a day (QID) | OTIC | Status: DC

## 2015-05-28 NOTE — Progress Notes (Signed)
Pre visit review using our clinic review tool, if applicable. No additional management support is needed unless otherwise documented below in the visit note. 

## 2015-05-28 NOTE — Patient Instructions (Signed)
I have sent in a prescription antibiotic ear drop,use as directed.   I have also sent in a prescription for Flexeril, this is a muscle relaxer. Try it at night first to see if it makes you sleepy.   Continue with Ibuprofen 600mg  every 8 hours.   Use a heating pad and do stretching exercises.   Follow up if no improvement.

## 2015-05-28 NOTE — Progress Notes (Signed)
Subjective:    Patient ID: Elizabeth Tanner, female    DOB: 03-Oct-1970, 45 y.o.   MRN: [id number]  HPI  45 year old female who presents to the office today for left sided face pain that is located around the ear. She has had this pain for about 6 days. Been using over-the-counter Motrin 600 mg every 4 hours. She denies any discharge from her ear.  Also complains of left-sided shoulder pain, worse with movement. She has had the shoulder pain for 2-3 days. Has also been taking 600 mg of Motrin every 4 hours for this. Has also been using heating pad which she endorses helps with the pain. Denies any weakness, tingling or numbness down left arm. No loss of grip strength. She denies any trauma to the area  Denies having a fever, nausea, vomiting, diarrhea.   Review of Systems  Constitutional: Positive for activity change and fatigue.  HENT: Positive for ear pain. Negative for ear discharge, facial swelling, hearing loss, postnasal drip, rhinorrhea, sinus pressure, sore throat and tinnitus.   Eyes: Negative.   Respiratory: Negative.   Cardiovascular: Negative.   Musculoskeletal: Positive for myalgias and back pain. Negative for arthralgias, neck pain and neck stiffness.  Skin: Negative.   All other systems reviewed and are negative.  Past Medical History  Diagnosis Date  . Depression     ? atypical  . Dysmenorrhea   . Gluten intolerance     self reported  . Fracture of fibula     rt  . History of tobacco use   . Knee injury     fall on snow fracture hewitt  . Wears glasses   . Pneumonia 8 12     LLL -clear now  . Hypertension   . History of nephrolithiasis     Social History   Social History  . Marital Status: Single    Spouse Name: N/A  . Number of Children: N/A  . Years of Education: N/A   Occupational History  . Not on file.   Social History Main Topics  . Smoking status: Former Smoker -- 1.50 packs/day for 8 years    Types: Cigarettes  . Smokeless tobacco: Never  Used  . Alcohol Use: 0.0 oz/week    0 Standard drinks or equivalent per week     Comment: occ  . Drug Use: No  . Sexual Activity: Not on file     Comment: is smoking a few cigs   Other Topics Concern  . 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                Past Surgical History  Procedure Laterality Date  . Breast reduction surgery  2001  . Mouth surgery    . Orif tibia plateau Right 05/05/2012    Procedure: OPEN REDUCTION INTERNAL FIXATION TIBIAL PLATEAU;  Surgeon: Wylene Simmer, MD;  Location: [facility];  Service: Orthopedics;  Laterality: Right;    Family History  Problem Relation Age of Onset  . Alcohol abuse    . Thyroid disease    . Arthritis      Father has a fused spine uncle with DJD  . Heart attack Maternal Uncle 30    Electrical problem  also maternal aunt  . Melanoma Cousin   . Other Maternal Aunt     68 pacer.     Allergies  Allergen Reactions  . Azithromycin Itching    Reports similar to erythromycin  . Amoxicillin Itching  . Gluten   . Vicodin [Hydrocodone-Acetaminophen] Nausea Only  . Penicillins Rash    No current outpatient prescriptions on file prior to visit.   No current facility-administered medications on file prior to visit.    BP 124/88 mmHg  Temp(Src) 98.9 F (37.2 C) (Oral)  Ht 4\' 11"  (1.499 m)  Wt 181 lb 6.4 oz (82.283 kg)  BMI 36.62 kg/m2       Objective:   Physical Exam  Constitutional: She is oriented to person, place, and time. She appears well-developed and well-nourished. No distress.  HENT:  Head: Normocephalic and atraumatic.  Right Ear: Hearing, tympanic membrane, external ear and ear canal normal.  Left Ear: Hearing, tympanic membrane and external ear normal.  Nose: Nose normal.  Mouth/Throat: Oropharynx is clear and moist. No  oropharyngeal exudate.  Otitis externa noted in left ear  Eyes: Conjunctivae and EOM are normal. Pupils are equal, round, and reactive to light. Right eye exhibits no discharge. Left eye exhibits no discharge. No scleral icterus.  Neck: Normal range of motion. Neck supple. No thyromegaly present.  Cardiovascular: Normal rate, regular rhythm, normal heart sounds and intact distal pulses.  Exam reveals no gallop and no friction rub.   No murmur heard. Pulmonary/Chest: Effort normal and breath sounds normal. No respiratory distress. She has no wheezes. She has no rales. She exhibits no tenderness.  Musculoskeletal: Normal range of motion. She exhibits tenderness (tenderness with palpation around left scapula. No decreased ROM noted. No bruising noted). She exhibits no edema.  Neurological: She is alert and oriented to person, place, and time.  Skin: Skin is warm and dry. No rash noted. She is not diaphoretic. No erythema. No pallor.  Psychiatric: She has a normal mood and affect. Her behavior is normal. Judgment and thought content normal.  Nursing note and vitals reviewed.      Assessment & Plan:  1. Muscle spasm of back - cyclobenzaprine (FLEXERIL) 10 MG tablet; Take 1 tablet (10 mg total) by mouth 3 (three) times daily as needed for muscle spasms.  Dispense: 30 tablet; Refill: 0 - Use Ibuprofen 600 mg every 8 hours -Continue to use a heating pad -Right stretching exercises -Follow-up if no improvement  2. Otitis externa, left - neomycin-polymyxin-hydrocortisone (CORTISPORIN) otic solution; Place 3 drops into the left ear 4 (four) times daily.  Dispense: 10 mL; Refill: 0 - Follow-up if no improvement

## 2015-12-30 ENCOUNTER — Encounter: Payer: Self-pay | Admitting: Internal Medicine

## 2015-12-30 ENCOUNTER — Ambulatory Visit (INDEPENDENT_AMBULATORY_CARE_PROVIDER_SITE_OTHER): Payer: BC Managed Care – PPO | Admitting: Internal Medicine

## 2015-12-30 VITALS — BP 150/100 | Temp 98.4°F | Wt 184.6 lb

## 2015-12-30 DIAGNOSIS — R350 Frequency of micturition: Secondary | ICD-10-CM | POA: Diagnosis not present

## 2015-12-30 DIAGNOSIS — N390 Urinary tract infection, site not specified: Secondary | ICD-10-CM

## 2015-12-30 DIAGNOSIS — R300 Dysuria: Secondary | ICD-10-CM | POA: Diagnosis not present

## 2015-12-30 DIAGNOSIS — Z87442 Personal history of urinary calculi: Secondary | ICD-10-CM

## 2015-12-30 LAB — POC URINALSYSI DIPSTICK (AUTOMATED)
Bilirubin, UA: NEGATIVE
GLUCOSE UA: NEGATIVE
Ketones, UA: NEGATIVE
Leukocytes, UA: NEGATIVE
NITRITE UA: NEGATIVE
Protein, UA: NEGATIVE
UROBILINOGEN UA: 0.2
pH, UA: 6.5

## 2015-12-30 MED ORDER — CIPROFLOXACIN HCL 500 MG PO TABS: 500.0000 mg | ORAL_TABLET | Freq: Two times a day (BID) | ORAL | 0 refills | Status: DC

## 2015-12-30 NOTE — Progress Notes (Signed)
Pre visit review using our clinic review tool, if applicable. No additional management support is needed unless otherwise documented below in the visit note.  Chief Complaint  Patient presents with  . Dysuria    Started yesterday  . Urinary Frequency    HPI: Elizabeth Tanner 45 y.o.  sda   Hx of utis .  Last one in a while back but this time acute onset of 1-2 days of dysuria lower abdominal pressure and discomfort. No gross hematuria. No fever chills or flank pain at this time. Begun on vacation this week. She has a history of stones and when she had pain last on the urologist advised she have physical therapy for pelvic floor cramps but she is doing her own exercises if needed.  Menopause .    ROS: See pertinent positives and negatives per HPI.  Past Medical History:  Diagnosis Date  . Depression    ? atypical  . Dysmenorrhea   . Fracture of fibula    rt  . Gluten intolerance    self reported  . History of nephrolithiasis   . History of tobacco use   . Hypertension   . Knee injury    fall on snow fracture hewitt  . Pneumonia 8 12    LLL -clear now  . Wears glasses     Family History  Problem Relation Age of Onset  . Alcohol abuse    . Thyroid disease    . Arthritis      Father has a fused spine uncle with DJD  . Heart attack Maternal Uncle 30    Electrical problem also maternal aunt  . Melanoma Cousin   . Other Maternal Aunt     68 pacer.     Social History   Social History  . Marital status: Single    Spouse name: N/A  . Number of children: N/A  . Years of education: N/A   Social History Main Topics  . Smoking status: Former Smoker    Packs/day: 1.50    Years: 8.00    Types: Cigarettes  . Smokeless tobacco: Never Used  . Alcohol use 0.0 oz/week     Comment: occ  . Drug use: No  . Sexual activity: Not Asked     Comment: is smoking a few cigs   Other Topics Concern  . 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                Outpatient Medications Prior to Visit  Medication Sig Dispense Refill  . cyclobenzaprine (FLEXERIL) 10 MG tablet Take 1 tablet (10 mg total) by mouth 3 (three) times daily as needed for muscle spasms. 30 tablet 0  . neomycin-polymyxin-hydrocortisone (CORTISPORIN) otic solution Place 3 drops into the left ear 4 (four) times daily. 10 mL 0   No facility-administered medications prior to visit.      EXAM:  BP (!) 150/100 (BP Location: Right Arm, Patient Position: Sitting, Cuff Size: Normal)   Temp 98.4 F (36.9 C) (Oral)   Wt 184 lb 9.6 oz (83.7 kg)   BMI 37.28 kg/m   Body mass index is 37.28 kg/m.  GENERAL: vitals reviewed and listed above, alert, oriented, appears well hydrated and in no acute distress HEENT:  atraumatic, conjunctiva  clear, no obvious abnormalities on inspection of external nose and ears No CVA tenderness. PSYCH: pleasant and cooperative, no obvious depression or anxiety Urinalysis shows 3+ blood negative leuks sent for culture ASSESSMENT AND PLAN:  Discussed the following assessment and plan:  Dysuria - Plan: POCT Urinalysis Dipstick (Automated), Urine culture  Urinary frequency - Plan: POCT Urinalysis Dipstick (Automated), Urine culture  Recurrent UTI  History of nephrolithiasis Reviewed record history of UTI also history of Proteus Bactrim had to be switched to Cipro will use Cipro this time for 5 days expectant management culture pending. -Patient advised to return or notify health care team  if symptoms worsen ,persist or new concerns arise.  Patient Instructions  Treating again for uti .  Can use otc azo  Begin antibiotic  Fu if  persistent or progressive     Mariann Laster K. Jadarius Commons M.D.

## 2015-12-30 NOTE — Patient Instructions (Signed)
Treating again for uti .  Can use otc azo  Begin antibiotic  Fu if  persistent or progressive

## 2016-01-01 LAB — URINE CULTURE

## 2016-08-05 IMAGING — MR MR ABDOMEN WO/W CM
10 of 18 series · 27 of 48 positions shown · IV contrast (eovist)
Comparison: Abdominal ultrasound dated 05/04/2014

CLINICAL DATA: Evaluate liver lesion on ultrasound

EXAM:
MRI ABDOMEN WITHOUT AND WITH CONTRAST
TECHNIQUE: Multiplanar multisequence MR imaging of the abdomen was performed
both before and after the administration of intravenous contrast.
CONTRAST:  8 mL Eovist IV

[Series 4: bSSFP · axial · 3.0mm · 0.74mm/px · 1 of 30 slices shown (1 of 3)]
[im 1/30]
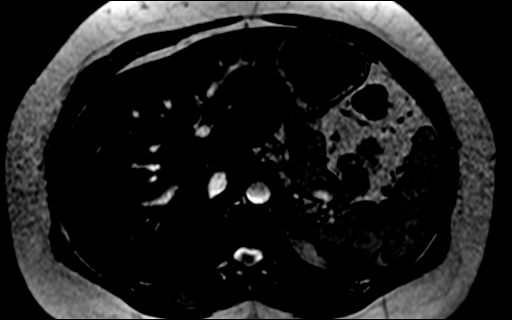

[Series 5: bSSFP · axial · 3.0mm · 0.74mm/px · 1 of 30 slices shown (2 of 3)]
[im 1/30]
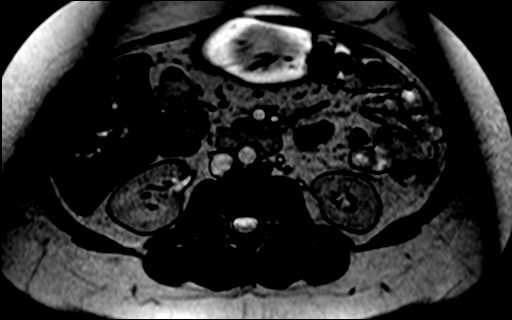

[Series 6: bSSFP · axial · 3.0mm · 0.74mm/px · 1 of 30 slices shown (3 of 3)]
[im 1/30]
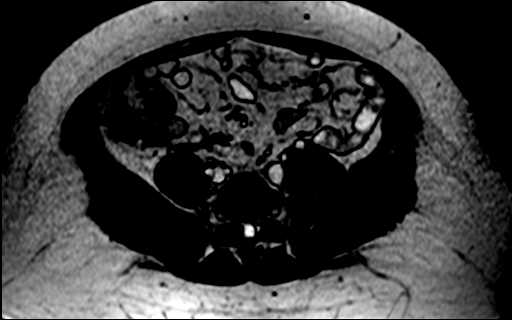

[Series 7: T1 · axial · 7.0mm · 0.74mm/px · 1 of 52 slices shown]
[im 1/52]
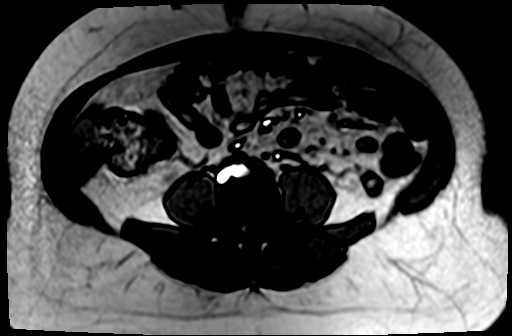

[Series 8: T1 dynamic · axial · non-contrast · 3.0mm · 0.74mm/px · z∈[-87,+150]mm · 4 of 80 slices shown]
[im 1/80]
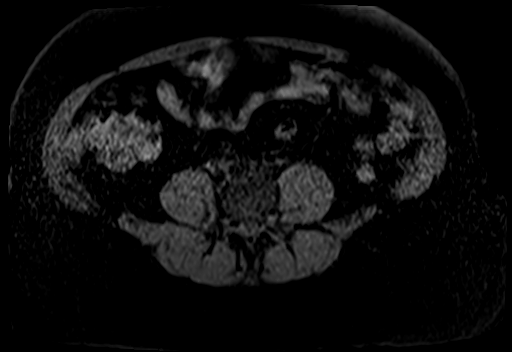
[im 27/80]
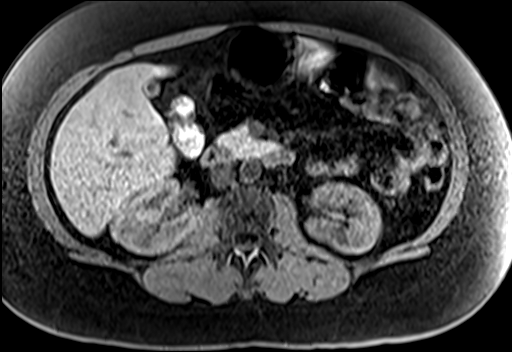
[im 53/80]
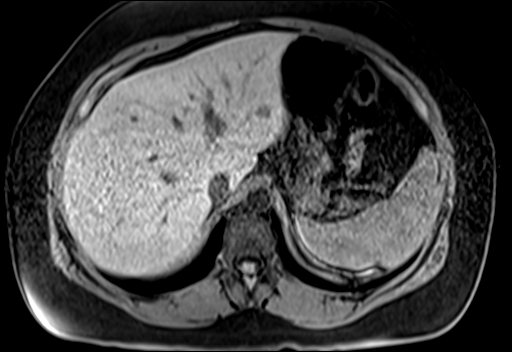
[im 80/80]
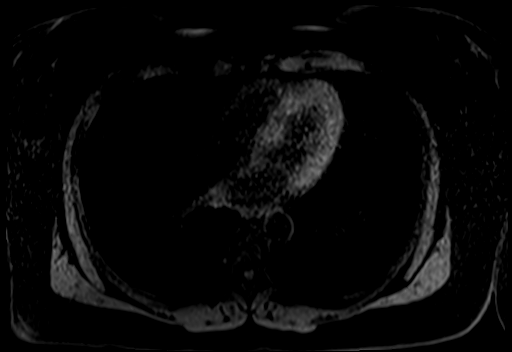

[Series 9: T1 dynamic post-contrast · axial · 3.0mm · 0.74mm/px · z∈[-87,+150]mm · 4 of 80 slices shown (1 of 5)]
[im 1/80]
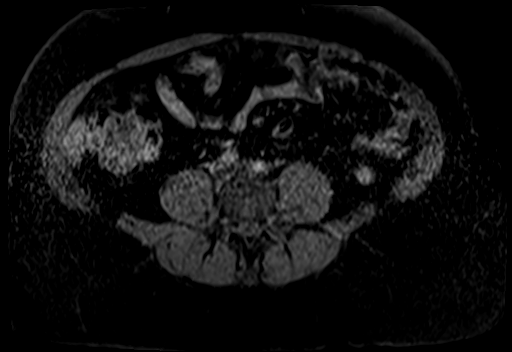
[im 27/80]
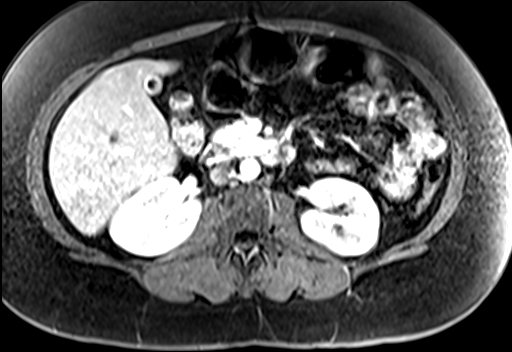
[im 53/80]
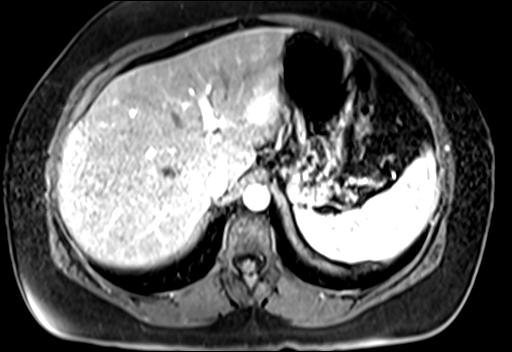
[im 80/80]
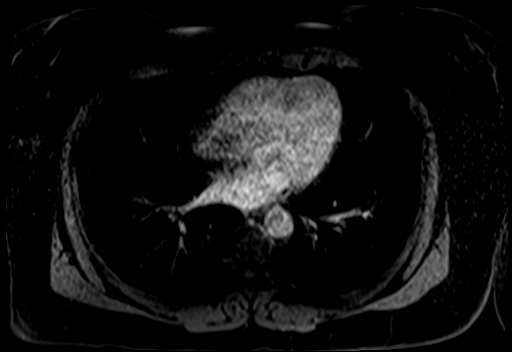

[Series 10: T1 dynamic post-contrast · axial · 3.0mm · 0.74mm/px · z∈[-87,+150]mm · 4 of 80 slices shown (2 of 5)]
[im 1/80]
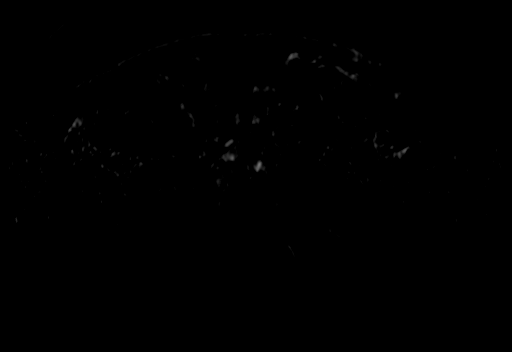
[im 27/80]
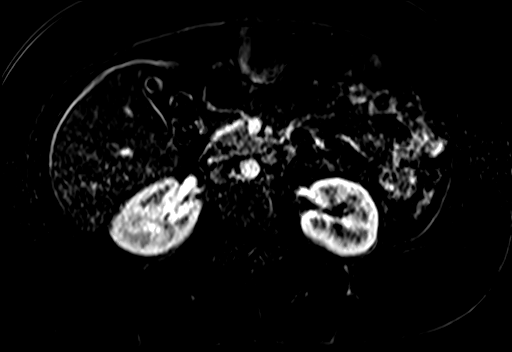
[im 53/80]
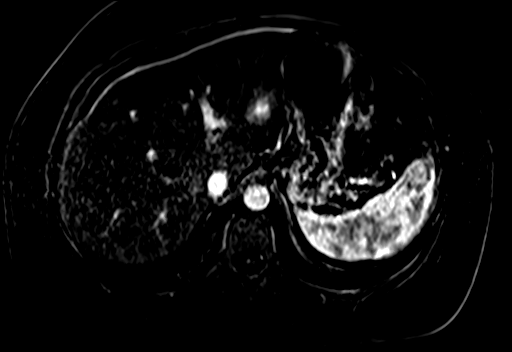
[im 80/80]
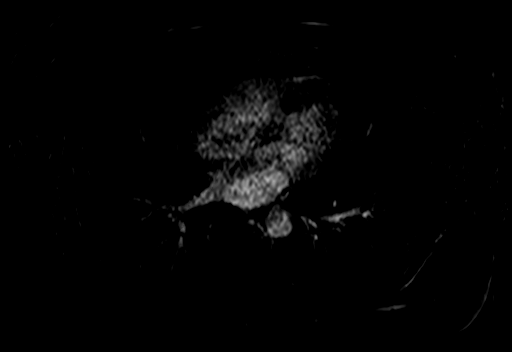

[Series 11: T1 dynamic post-contrast · axial · 3.0mm · 0.74mm/px · z∈[-87,+150]mm · 4 of 80 slices shown (3 of 5)]
[im 1/80]
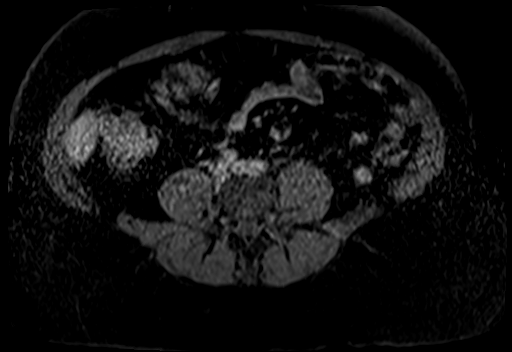
[im 27/80]
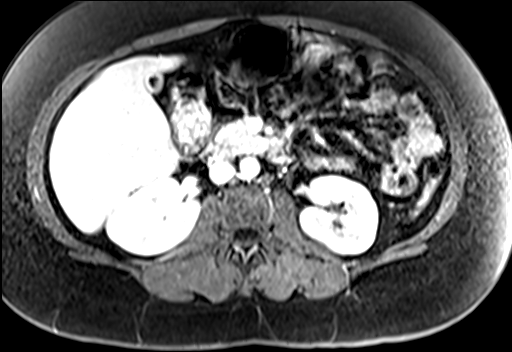
[im 53/80]
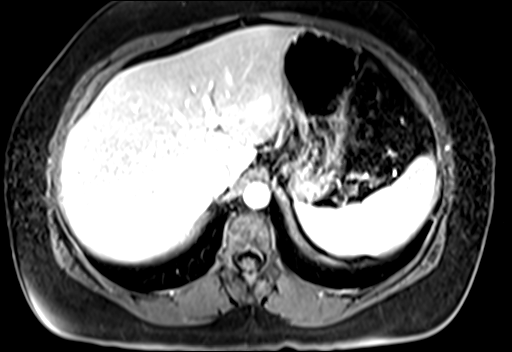
[im 80/80]
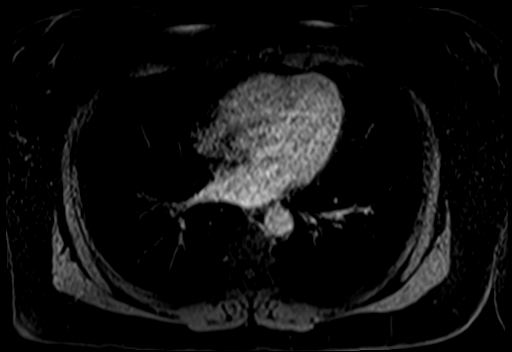

[Series 12: T1 dynamic post-contrast · axial · 3.0mm · 0.74mm/px · z∈[-87,+150]mm · 4 of 80 slices shown (4 of 5)]
[im 1/80]
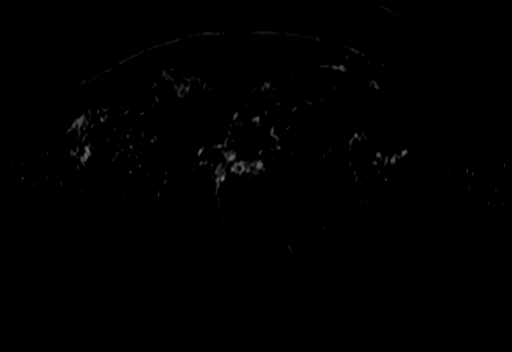
[im 27/80]
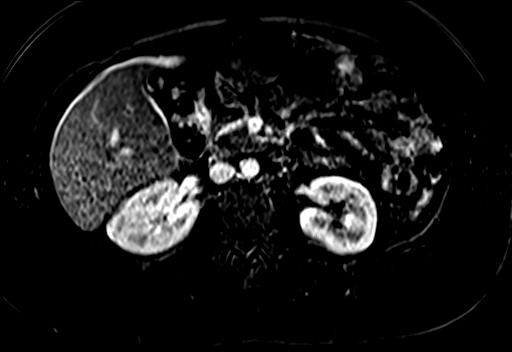
[im 53/80]
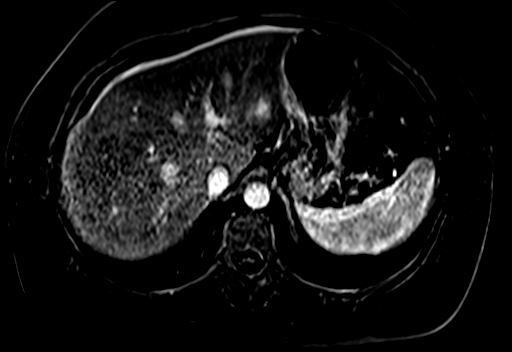
[im 80/80]
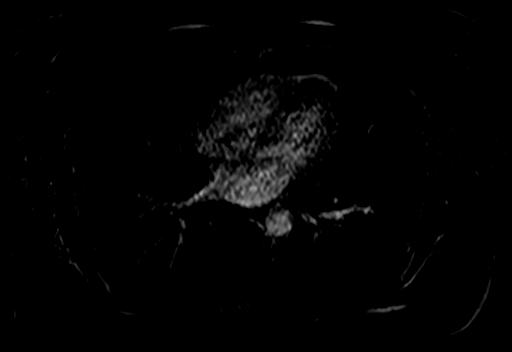

[Series 13: T1 dynamic post-contrast · axial · 3.0mm · 0.74mm/px · z∈[-87,+69]mm · 3 of 80 slices shown (5 of 5)]
[im 1/80]
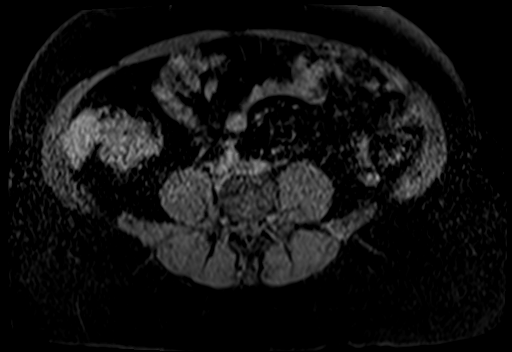
[im 27/80]
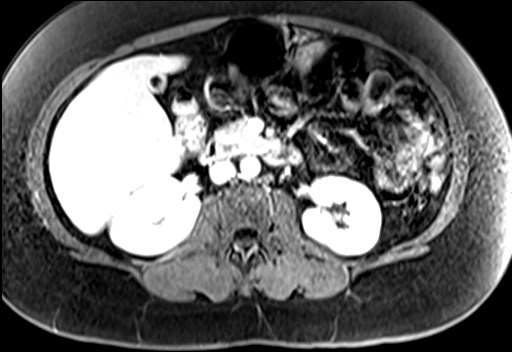
[im 53/80]
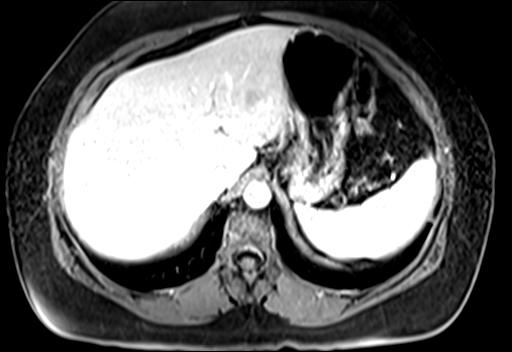

[27 of 48 positions shown; findings below may reference images not displayed]

FINDINGS: Lower chest:  Lung bases are clear.

Hepatobiliary: Moderate hepatic steatosis. Focal fatty sparing in
the left hepatic lobe (series 7/image 15).

12 mm T2 hyperintense lesion in the lateral segment left hepatic
dome (series 16/image 10) with associated early arterial enhancement
(series 11/ image 27) and washout on delayed Eovist (series 21/
image 28). No morphologic signs of cirrhosis. This appearance
suggests a benign hemangioma.

Gallbladder is underdistended. No intrahepatic or extrahepatic
ductal dilatation.

Pancreas: Within normal limits.

Spleen: Within normal limits.

Adrenals/Urinary Tract: Adrenal glands are within normal limits.

Mild scarring of the left upper kidney.

Right kidney is within normal limits.  No hydronephrosis.

Stomach/Bowel: Stomach and visualized bowel are within normal
limits.

Vascular/Lymphatic: No evidence of abdominal aortic aneurysm.

No suspicious abdominal lymphadenopathy.

Other: No abdominal ascites.

Musculoskeletal: No focal osseous lesions.
IMPRESSION: Moderate hepatic steatosis with focal fatty sparing in the left
hepatic lobe.

12 mm T2 hyperintense lesion in the lateral segment left hepatic
dome, with early arterial enhancement, likely reflecting a flash
filling hemangioma.

No suspicious hepatic lesions.

## 2016-08-18 ENCOUNTER — Encounter (HOSPITAL_COMMUNITY): Payer: Self-pay | Admitting: Emergency Medicine

## 2016-08-18 ENCOUNTER — Emergency Department (HOSPITAL_COMMUNITY)
Admission: EM | Admit: 2016-08-18 | Discharge: 2016-08-18 | Disposition: A | Discharge status: Home / Self Care | Payer: BC Managed Care – PPO | Attending: Emergency Medicine | Admitting: Emergency Medicine

## 2016-08-18 ENCOUNTER — Telehealth: Payer: Self-pay

## 2016-08-18 DIAGNOSIS — Z79899 Other long term (current) drug therapy: Secondary | ICD-10-CM | POA: Insufficient documentation

## 2016-08-18 DIAGNOSIS — Z87891 Personal history of nicotine dependence: Secondary | ICD-10-CM | POA: Insufficient documentation

## 2016-08-18 DIAGNOSIS — I10 Essential (primary) hypertension: Secondary | ICD-10-CM | POA: Diagnosis present

## 2016-08-18 DIAGNOSIS — F909 Attention-deficit hyperactivity disorder, unspecified type: Secondary | ICD-10-CM | POA: Insufficient documentation

## 2016-08-18 DIAGNOSIS — R030 Elevated blood-pressure reading, without diagnosis of hypertension: Secondary | ICD-10-CM

## 2016-08-18 LAB — COMPREHENSIVE METABOLIC PANEL
ALK PHOS: 64 U/L (ref 38–126)
ALT: 35 U/L (ref 14–54)
AST: 26 U/L (ref 15–41)
Albumin: 4.7 g/dL (ref 3.5–5.0)
Anion gap: 12 (ref 5–15)
BUN: 14 mg/dL (ref 6–20)
CO2: 27 mmol/L (ref 22–32)
Calcium: 10.2 mg/dL (ref 8.9–10.3)
Chloride: 100 mmol/L — ABNORMAL LOW (ref 101–111)
Creatinine, Ser: 0.65 mg/dL (ref 0.44–1.00)
Glucose, Bld: 98 mg/dL (ref 65–99)
Potassium: 3.8 mmol/L (ref 3.5–5.1)
SODIUM: 139 mmol/L (ref 135–145)
Total Bilirubin: 0.8 mg/dL (ref 0.3–1.2)
Total Protein: 7.6 g/dL (ref 6.5–8.1)

## 2016-08-18 LAB — CBC WITH DIFFERENTIAL/PLATELET
Basophils Absolute: 0 10*3/uL (ref 0.0–0.1)
Basophils Relative: 0 %
Eosinophils Absolute: 0.2 10*3/uL (ref 0.0–0.7)
Eosinophils Relative: 2 %
HCT: 42.6 % (ref 36.0–46.0)
HEMOGLOBIN: 14.9 g/dL (ref 12.0–15.0)
LYMPHS ABS: 2.2 10*3/uL (ref 0.7–4.0)
LYMPHS PCT: 28 %
MCH: 31.3 pg (ref 26.0–34.0)
MCHC: 35 g/dL (ref 30.0–36.0)
MCV: 89.5 fL (ref 78.0–100.0)
Monocytes Absolute: 0.4 10*3/uL (ref 0.1–1.0)
Monocytes Relative: 5 %
NEUTROS ABS: 5 10*3/uL (ref 1.7–7.7)
NEUTROS PCT: 65 %
Platelets: 158 10*3/uL (ref 150–400)
RBC: 4.76 MIL/uL (ref 3.87–5.11)
RDW: 12.1 % (ref 11.5–15.5)
WBC: 7.7 10*3/uL (ref 4.0–10.5)

## 2016-08-18 MED ORDER — LORAZEPAM 2 MG/ML IJ SOLN
0.5000 mg | Freq: Once | INTRAMUSCULAR | Status: AC
  Administered 2016-08-18: 0.5 mg via INTRAVENOUS
  Filled 2016-08-18: qty 1

## 2016-08-18 MED ORDER — SODIUM CHLORIDE 0.9 % IV BOLUS (SEPSIS)
500.0000 mL | Freq: Once | INTRAVENOUS | Status: AC
  Administered 2016-08-18: 500 mL via INTRAVENOUS

## 2016-08-18 NOTE — ED Triage Notes (Signed)
Patient complaining of high blood pressure. Patient states that Sunday she got a headache and she could tell that her blood pressure was high. She went to work today and checked her blood pressure and it was high. So she came to the ed.

## 2016-08-18 NOTE — ED Provider Notes (Signed)
[facility] DEPT Provider Note   CSN: [id number] Arrival date & time: 08/18/16  1521     History   Chief Complaint Chief Complaint  Patient presents with  . Hypertension    HPI Elizabeth Tanner is a 46 y.o. female.  The history is provided by the patient and medical records. No language interpreter was used.  Hypertension  Associated symptoms include headaches (resolved).   Elizabeth Tanner is a 46 y.o. female who presents to the Emergency Department for concerns of elevated blood pressure. Patient states that she was diagnosed with hypertension 6 years ago and on medication, however she clipped a stressful job and blood pressure improved. She has not been on any medications for her blood pressure over the last 6 years and has had no problems. She states that she went to work today and checked her blood pressure because she felt "woozy" - no dizziness or weakness. Blood pressure was 160/113. She also notes associated headache over the weekend described as feeling "tight". No headache currently. She called her primary care doctor and scheduled an appointment for tomorrow. She began getting very worried, and checked her blood pressure 1 more time and it had increased to 200s/110s, prompting her to come to the emergency department. No chest pain, shortness of breath, abdominal pain, visual changes. No fever, chills. She states that she feels like she needs something to "calm herself down" because she is very worried about her blood pressure being so high. She does state that she has a history of migraines and had a migraine on Sunday consistent with her typical headaches. She felt this may be contributory to her blood pressure being high the last several days.   Past Medical History:  Diagnosis Date  . Depression    ? atypical  . Dysmenorrhea   . Fracture of fibula    rt  . Gluten intolerance    self reported  . History of nephrolithiasis   . History of tobacco use   . Hypertension     . Knee injury    fall on snow fracture hewitt  . Pneumonia 8 12    LLL -clear now  . Wears glasses     Patient Active Problem List   Diagnosis Date Noted  . History of nephrolithiasis   . Visit for preventive health examination 12/13/2013  . Hyperglycemia 12/13/2013  . Amenorrhea 12/13/2013  . Sinusitis 02/06/2013  . Encounter for management and injection of injectable progestin contraceptive 08/16/2011  . LFTs abnormal minor alt 08/16/2011  . Hypertension 03/18/2011  . Stress at work 03/18/2011  . Hair loss 12/26/2010  . Thyroiditis 12/26/2010  . Dyspnea 11/06/2010  . History of tobacco use 11/06/2010  . Other and unspecified hyperlipidemia 07/31/2010  . Preventative health care 07/30/2010  . Elevated blood pressure reading 07/30/2010  . Depo-Provera contraceptive status 07/16/2010  . 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 NONSPECIFIC ABNORM RESULTS THYROID FUNCT STUDY 11/22/2008  . MYALGIA 11/13/2008  . FATIGUE 11/13/2008  . OBESITY 07/23/2008  . NUMBNESS, ARM 09/12/2007  . NECK PAIN 08/31/2007  . ARM PAIN, RIGHT 08/31/2007  . SLEEPLESSNESS 02/28/2007  . DEPRESSION 11/05/2006  . DYSMENORRHEA 11/05/2006  . IRREGULAR MENSTRUATION 11/05/2006  . ATTENTION DEFICIT DISORDER, HX OF 11/05/2006    Past Surgical History:  Procedure Laterality Date  . BREAST REDUCTION SURGERY  2001  . MOUTH SURGERY    . ORIF TIBIA PLATEAU Right 05/05/2012   Procedure: OPEN REDUCTION INTERNAL FIXATION TIBIAL PLATEAU;  Surgeon: Wylene Simmer, MD;  Location: [facility];  Service: Orthopedics;  Laterality: Right;    OB History    No data available       Home Medications    Prior to Admission medications   Medication Sig Start Date End Date Taking? Authorizing Provider  VITAMIN B COMPLEX-C PO Take 1 tablet by mouth once.   Yes [provider]  ciprofloxacin (CIPRO) 500 MG tablet Take 1 tablet (500 mg total) by mouth 2 (two) times  daily. Patient not taking: Reported on 08/18/2016 12/30/15   Panosh, Standley Brooking, MD    Family History Family History  Problem Relation Age of Onset  . Alcohol abuse Unknown   . Thyroid disease Unknown   . Arthritis Unknown        Father has a fused spine uncle with DJD  . Heart attack Maternal Uncle 30       Electrical problem also maternal aunt  . Melanoma Cousin   . Other Maternal Aunt        68 pacer.     Social History Social History  Substance Use Topics  . Smoking status: Former Smoker    Packs/day: 1.50    Years: 8.00    Types: Cigarettes  . Smokeless tobacco: Never Used  . Alcohol use 0.0 oz/week     Comment: occ     Allergies   Azithromycin; Amoxicillin; Gluten; Vicodin [hydrocodone-acetaminophen]; and Penicillins   Review of Systems Review of Systems  Neurological: Positive for light-headedness (feels "woozy") and headaches (resolved).  All other systems reviewed and are negative.    Physical Exam Updated Vital Signs BP (!) 160/116 (BP Location: Right Arm)   Pulse 84   Temp 98.7 F (37.1 C) (Oral)   Resp 16   Ht 4\' 11"  (1.499 m)   Wt 81.6 kg (180 lb)   SpO2 97%   BMI 36.36 kg/m   Physical Exam  Constitutional: She is oriented to person, place, and time. She appears well-developed and well-nourished. No distress.  HENT:  Head: Normocephalic and atraumatic.  Neck: No JVD present.  Cardiovascular: Normal rate, regular rhythm and normal heart sounds.   No murmur heard. Pulmonary/Chest: Effort normal and breath sounds normal. No respiratory distress. She has no wheezes. She has no rales.  Abdominal: Soft. She exhibits no distension. There is no tenderness.  Musculoskeletal: She exhibits no edema.  Neurological: She is alert and oriented to person, place, and time.  Skin: Skin is warm and dry.  Nursing note and vitals reviewed.    ED Treatments / Results  Labs (all labs ordered are listed, but only abnormal results are displayed) Labs Reviewed   COMPREHENSIVE METABOLIC PANEL - Abnormal; Notable for the following:       Result Value   Chloride 100 (*)    All other components within normal limits  CBC WITH DIFFERENTIAL/PLATELET    EKG  EKG Interpretation None       Radiology No results found.  Procedures Procedures (including critical care time)  Medications Ordered in ED Medications  sodium chloride 0.9 % bolus 500 mL (500 mLs Intravenous New Bag/Given 08/18/16 1726)  LORazepam (ATIVAN) injection 0.5 mg (0.5 mg Intravenous Given 08/18/16 1725)     Initial Impression / Assessment and Plan / ED Course  I have reviewed the triage vital signs and the nursing notes.  Pertinent labs & imaging results that were available during my care of the patient were reviewed by me and considered in  my medical decision making (see chart for details).    Elizabeth Tanner is a 46 y.o. female who presents to ED for elevated blood pressure. Patient is currently asymptomatic with no sxs to suggest end organ damage. No chest pain, diaphoresis, nausea or other ACS symptoms. She did have a headache on Sunday (2 days ago) which she states is c/w her typical migraines. No headache or neurologic complaints today. No change in urine output. Normal cardiopulmonary exam. Intact and equal distal pulses. Labs reviewed and reassuring. Evaluation does not show pathology that would require ongoing emergent intervention or inpatient treatment. Patient is not currently on BP medication. She has called her PCP and scheduled an appointment for tomorrow. Discussed DASH diet and importance of keeping PCP appointment tomorrow. Reasons to return to ER were discussed and all questions answered.   Final Clinical Impressions(s) / ED Diagnoses   Final diagnoses:  Elevated blood pressure reading    New Prescriptions New Prescriptions   No medications on file     [place] Nishi, Ozella Almond, PA-C 08/18/16 1833    Lacretia Leigh, MD 08/21/16 [phone]

## 2016-08-18 NOTE — Discharge Instructions (Signed)
It was my pleasure taking care of you today!  Please keep your scheduled appointment with your primary care physician tomorrow.  Please let them know that your blood pressure was 160/116 in the ER today.  Return to ER for new or worsening symptoms, any additional concerns.

## 2016-08-18 NOTE — Telephone Encounter (Signed)
Patient called with c/o elevated BP at 210/116, headache, lightheadedness and left leg tingling. Pt states she did have migraine this past weekend but that has improved. These other symptoms began yesterday. Advised pt to seek care at ED immediately. She agreed and will have a friend take her now. Nothing further needed at this time.

## 2016-08-18 NOTE — Progress Notes (Signed)
Chief Complaint  Patient presents with  . Hypertension    HPI: Elizabeth Tanner 46 y.o. come in for concern about BP And follow-up of emergency room visit yesterday. 2 days before had the onset ofHad sever   Migraine severe headache and vomiting that she hadn't had in quite a while and then developed tingling in all extremities was worried about having a strok and then better and when took bp  160 not that bac headache and then went up. 210  116. Had tingling given lorazepam in ed and eval   bp up and down . Some stress related elevated blood pressure the past related to stress but had been better.  To have  Lump off  Foot.    [facility].   She states that her blood pressure has to be controlled before her surgery. No new medicines works at [employer]. Had some stressful things recently Remote history of thyroiditis. No regular check recently No excess alcohol. Family history of high blood pressure. Sow ant  ROS: See pertinent positives and negatives per HPI. She has menopausal premature menopause. Past Medical History:  Diagnosis Date  . Depression    ? atypical  . Dysmenorrhea   . Fracture of fibula    rt  . Gluten intolerance    self reported  . History of nephrolithiasis   . History of tobacco use   . Hypertension   . Knee injury    fall on snow fracture hewitt  . Pneumonia 8 12    LLL -clear now  . Wears glasses     Family History  Problem Relation Age of Onset  . Alcohol abuse Unknown   . Thyroid disease Unknown   . Arthritis Unknown        Father has a fused spine uncle with DJD  . Heart attack Maternal Uncle 30       Electrical problem also maternal aunt  . Melanoma Cousin   . Other Maternal Aunt        68 pacer.     Social History   Social History  . Marital status: Single    Spouse name: N/A  . Number of children: N/A  . Years of education: N/A   Social History Main Topics  . Smoking status: Former Smoker   Packs/day: 1.50    Years: 8.00    Types: Cigarettes  . Smokeless tobacco: Never Used  . Alcohol use 0.0 oz/week     Comment: occ  . Drug use: No  . Sexual activity: Not Asked     Comment: is smoking a few cigs   Other Topics Concern  . 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                   EXAM:  BP (!) 130/91 (BP Location: Right Arm, Patient Position: Sitting, Cuff Size: Normal)   Pulse 91   Temp 98 F (36.7 C) (Oral)   Ht 4\' 11"  (1.499 m)   Wt 182 lb 6.4 oz (82.7 kg)   BMI 36.84 kg/m   Body mass index is 36.84 kg/m.  GENERAL: vitals reviewed and listed above, alert, oriented, appears well hydrated and in no acute distress some excess  motor activity mildly anxious but normal thought process and speech. HEENT: atraumatic, conjunctiva  clear, no obvious abnormalities on inspection of external nose and ears OP : no lesion edema or exudate tongue is midline NECK: no obvious masses on inspection palpation  LUNGS: clear to auscultation bilaterally, no wheezes, rales or rhonchi, good air movement CV: HRRR, no clubbing cyanosis or  peripheral edema nl cap refill  Abdomen:  Sof,t normal bowel sounds without hepatosplenomegaly, no guarding rebound or masses no CVA tenderness MS: moves all extremities without noticeable focal  Abnormality r foot soft tissue mass medical  Right  PSYCH: pleasant and cooperative, no obvious depression  Mild anxiety  Lab Results  Component Value Date   WBC 7.7 08/18/2016   HGB 14.9 08/18/2016   HCT 42.6 08/18/2016   PLT 158 08/18/2016   GLUCOSE 98 08/18/2016   CHOL 264 (H) 08/19/2016   TRIG 189.0 (H) 08/19/2016   HDL 45.90 08/19/2016   LDLDIRECT 178.5 12/06/2013   LDLCALC 180 (H) 08/19/2016   ALT 35 08/18/2016   AST 26 08/18/2016   NA 139 08/18/2016   K 3.8 08/18/2016   CL  100 (L) 08/18/2016   CREATININE 0.65 08/18/2016   BUN 14 08/18/2016   CO2 27 08/18/2016   TSH 2.06 08/19/2016   HGBA1C 5.5 08/19/2016   BP Readings from Last 3 Encounters:  08/19/16 (!) 130/91  08/18/16 (!) 160/116  12/30/15 (!) 150/100   Reviewed ED notes and visit. ASSESSMENT AND PLAN:  Discussed the following assessment and plan:  Hypertension, unspecified type - Probably baseline elevation aggravated by stress. Reasonable to add low-dose medication again use low-dose arbshe has a history of allergies  Hyperlipidemia, unspecified hyperlipidemia type - Plan: TSH, T4, free, Lipid panel, Hemoglobin A1c  Tingling - Consider a form of hyper ventilation but no respiratory distress check metabolic thyroid blood sugar - Plan: TSH, T4, free, Lipid panel, Hemoglobin A1c  Thyroiditis - hx of check levels - Plan: TSH, T4, free, Lipid panel, Thyroid antibodies  Migraine without aura and without status migrainosus, not intractable - recurrance recentlyu Due for yearly in summer  Begin med  And fu Counseled.  -Patient advised to return or notify health care team  if  new concerns arise.  Patient Instructions  Checking your thyroid and cholesterol blood sugar as we discussed. I suspect this is stress and migraine plus a slight elevation of baseline blood pressure that got worse when you had a migraine. We'll begin blood pressure medicine low-dose and plan follow-up with her checkup in July.  If you feel bp too low then take 1/2 dose  .  We'll let you know labs in the meantime. Stress reduction and avoid migraine triggers.     Recurrent Migraine Headache Migraines are a type of headache, and they are usually stronger and more sudden than normal headaches (tension headaches). Migraines are characterized by an intense pulsing, throbbing pain that is usually only present on one side of the head. Sometimes, migraine headaches can cause nausea, vomiting, sensitivity to light and sound, and  vision changes. Recurrent migraines keep coming back (recurring). A migraine can last from 4 hours up to 3 days. What are the causes? The exact cause of this condition is not known. However, a migraine may be caused when nerves in the brain become irritated and release chemicals that cause inflammation of blood vessels. This inflammation causes pain. Certain things may also trigger migraines, such as:  A disruption in your regular eating and sleeping schedule.  Smoking.  Stress.  Menstruation.  Certain foods and drinks, such as: ? Aged cheese. ? Chocolate. ? Alcohol. ? Caffeine. ? Foods or drinks that contain nitrates, glutamate, aspartame, MSG, or tyramine.  Lack of sleep.  Hunger.  Physical exertion.  Fatigue.  High altitude.  Weather changes.  Medicines, such as: ? Nitroglycerin, which is used to treat chest pain. ? Birth control pills. ? Estrogen. ? Some blood pressure medicines.  What are the signs or symptoms? Symptoms of this condition vary for each person and may include:  Pain that is usually only present on one side of the head. In some cases, the pain may be on both sides of the head or around the head or neck.  Pulsating or throbbing pain.  Severe pain that prevents daily activities.  Pain that is aggravated by any physical activity.  Nausea, vomiting, or both.  Dizziness.  Pain with exposure to bright lights, loud noises, or activity.  General sensitivity to bright lights, loud noises, or smells.  Before you get a migraine, you may get warning signs that a migraine is coming (aura). An aura may include:  Seeing flashing lights.  Seeing bright spots, halos, or zigzag lines.  Having tunnel vision or blurred vision.  Having numbness or a tingling feeling.  Having trouble talking.  Having muscle weakness.  Smelling a certain odor.  How is this diagnosed? This condition is often diagnosed based on:  Your symptoms and medical  history.  A physical exam.  You may also have tests, including:  A CT scan or MRI of your brain. These imaging tests cannot diagnose migraines, but they can help to rule out other causes of headaches.  Blood tests.  How is this treated? This condition is treated with:  Medicines. These are used for: ? Lessening pain and nausea. ? Preventing recurrent migraines.  Lifestyle changes, such as changes to your diet or sleeping patterns.  Behavior therapy, such as relaxation training or biofeedback. Biofeedback is a treatment that involves teaching you to relax and use your brain to lower your heart rate and control your breathing.  Follow these instructions at home: Medicines  Take over-the-counter and prescription medicines only as told by your health care provider.  Do not drive or use heavy machinery while taking prescription pain medicine. Lifestyle  Do not use any products that contain nicotine or tobacco, such as cigarettes and e-cigarettes. If you need help quitting, ask your health care provider.  Limit alcohol intake to no more than 1 drink a day for nonpregnant women and 2 drinks a day for men. One drink equals 12 oz of beer, 5 oz of wine, or 1 oz of hard liquor.  Get 7-9 hours of sleep each night, or the amount of sleep recommended by your health care provider.  Limit your stress. Talk with your health care provider if you need help with stress management.  Maintain a healthy weight. If you need help losing weight, ask your health care provider.  Exercise regularly. Aim for 150 minutes of moderate-intensity exercise (walking, biking, yoga) or 75 minutes of vigorous exercise (running, circuit training, swimming) each week. General instructions  Keep a journal to find out what triggers your migraine headaches so you can avoid these triggers. For example, write down: ? What you eat and drink. ? How much sleep you get. ? Any change to your diet or medicines.  Lie down  in a dark, quiet room when you have a migraine.  Try placing a  cool towel over your head when you have a migraine.  Keep lights dim, if bright lights bother you and make your migraines worse.  Keep all follow-up visits as told by your health care provider. This is important. Contact a health care provider if:  Your pain does not improve, even with medicine.  Your migraines continue to return, even with medicine.  You have a fever.  You have weight loss. Get help right away if:  Your migraine becomes severe and medicine does not help.  You have a stiff neck.  You have a loss of vision.  You have muscle weakness or loss of muscle control.  You start losing your balance or have trouble walking.  You feel faint or you pass out.  You develop new, severe symptoms.  You start having abrupt severe headaches that last for a second or less, like a thunderclap. Summary  Migraine headaches are usually stronger and more sudden than normal headaches (tension headaches). Migraines are characterized by an intense pulsing, throbbing pain that is usually only present on one side of the head.  The exact cause of this condition is not known. However, a migraine may be caused when nerves in the brain become irritated and release chemicals that cause inflammation of blood vessels.  Certain things may trigger migraines, such as changes to diet or sleeping patterns, smoking, certain foods, alcohol, stress, and certain medicines.  Sometimes, migraine headaches can cause nausea, vomiting, sensitivity to light and sound, and vision changes.  Migraines are often diagnosed based on your symptoms, medical history, and a physical exam. This information is not intended to replace advice given to you by your health care provider. Make sure you discuss any questions you have with your health care provider. Document Released: 11/25/2000 Document Revised: 12/13/2015 Document Reviewed: 12/13/2015 Elsevier  Interactive Patient Education  2018 [place]. Collyns Mcquigg M.D.

## 2016-08-19 ENCOUNTER — Encounter: Payer: Self-pay | Admitting: Internal Medicine

## 2016-08-19 ENCOUNTER — Ambulatory Visit (INDEPENDENT_AMBULATORY_CARE_PROVIDER_SITE_OTHER): Payer: BC Managed Care – PPO | Admitting: Internal Medicine

## 2016-08-19 VITALS — BP 130/91 | HR 91 | Temp 98.0°F | Ht 59.0 in | Wt 182.4 lb

## 2016-08-19 DIAGNOSIS — G43009 Migraine without aura, not intractable, without status migrainosus: Secondary | ICD-10-CM | POA: Diagnosis not present

## 2016-08-19 DIAGNOSIS — E069 Thyroiditis, unspecified: Secondary | ICD-10-CM

## 2016-08-19 DIAGNOSIS — E785 Hyperlipidemia, unspecified: Secondary | ICD-10-CM

## 2016-08-19 DIAGNOSIS — R202 Paresthesia of skin: Secondary | ICD-10-CM | POA: Diagnosis not present

## 2016-08-19 DIAGNOSIS — I10 Essential (primary) hypertension: Secondary | ICD-10-CM | POA: Diagnosis not present

## 2016-08-19 LAB — TSH: TSH: 2.06 u[IU]/mL (ref 0.35–4.50)

## 2016-08-19 LAB — LIPID PANEL
Cholesterol: 264 mg/dL — ABNORMAL HIGH (ref 0–200)
HDL: 45.9 mg/dL (ref >39.00)
LDL Cholesterol: 180 mg/dL — ABNORMAL HIGH (ref 0–99)
NonHDL: 217.93
TRIGLYCERIDES: 189 mg/dL — AB (ref 0.0–149.0)
Total CHOL/HDL Ratio: 6
VLDL: 37.8 mg/dL (ref 0.0–40.0)

## 2016-08-19 LAB — HEMOGLOBIN A1C: Hgb A1c MFr Bld: 5.5 % (ref 4.6–6.5)

## 2016-08-19 LAB — T4, FREE: FREE T4: 1 ng/dL (ref 0.60–1.60)

## 2016-08-19 MED ORDER — LOSARTAN POTASSIUM 50 MG PO TABS: 50.0000 mg | ORAL_TABLET | Freq: Every day | ORAL | 1 refills | Status: DC

## 2016-08-19 NOTE — Patient Instructions (Addendum)
Checking your thyroid and cholesterol blood sugar as we discussed. I suspect this is stress and migraine plus a slight elevation of baseline blood pressure that got worse when you had a migraine. We'll begin blood pressure medicine low-dose and plan follow-up with her checkup in July.  If you feel bp too low then take 1/2 dose  .  We'll let you know labs in the meantime. Stress reduction and avoid migraine triggers.     Recurrent Migraine Headache Migraines are a type of headache, and they are usually stronger and more sudden than normal headaches (tension headaches). Migraines are characterized by an intense pulsing, throbbing pain that is usually only present on one side of the head. Sometimes, migraine headaches can cause nausea, vomiting, sensitivity to light and sound, and vision changes. Recurrent migraines keep coming back (recurring). A migraine can last from 4 hours up to 3 days. What are the causes? The exact cause of this condition is not known. However, a migraine may be caused when nerves in the brain become irritated and release chemicals that cause inflammation of blood vessels. This inflammation causes pain. Certain things may also trigger migraines, such as:  A disruption in your regular eating and sleeping schedule.  Smoking.  Stress.  Menstruation.  Certain foods and drinks, such as: ? Aged cheese. ? Chocolate. ? Alcohol. ? Caffeine. ? Foods or drinks that contain nitrates, glutamate, aspartame, MSG, or tyramine.  Lack of sleep.  Hunger.  Physical exertion.  Fatigue.  High altitude.  Weather changes.  Medicines, such as: ? Nitroglycerin, which is used to treat chest pain. ? Birth control pills. ? Estrogen. ? Some blood pressure medicines.  What are the signs or symptoms? Symptoms of this condition vary for each person and may include:  Pain that is usually only present on one side of the head. In some cases, the pain may be on both sides of the  head or around the head or neck.  Pulsating or throbbing pain.  Severe pain that prevents daily activities.  Pain that is aggravated by any physical activity.  Nausea, vomiting, or both.  Dizziness.  Pain with exposure to bright lights, loud noises, or activity.  General sensitivity to bright lights, loud noises, or smells.  Before you get a migraine, you may get warning signs that a migraine is coming (aura). An aura may include:  Seeing flashing lights.  Seeing bright spots, halos, or zigzag lines.  Having tunnel vision or blurred vision.  Having numbness or a tingling feeling.  Having trouble talking.  Having muscle weakness.  Smelling a certain odor.  How is this diagnosed? This condition is often diagnosed based on:  Your symptoms and medical history.  A physical exam.  You may also have tests, including:  A CT scan or MRI of your brain. These imaging tests cannot diagnose migraines, but they can help to rule out other causes of headaches.  Blood tests.  How is this treated? This condition is treated with:  Medicines. These are used for: ? Lessening pain and nausea. ? Preventing recurrent migraines.  Lifestyle changes, such as changes to your diet or sleeping patterns.  Behavior therapy, such as relaxation training or biofeedback. Biofeedback is a treatment that involves teaching you to relax and use your brain to lower your heart rate and control your breathing.  Follow these instructions at home: Medicines  Take over-the-counter and prescription medicines only as told by your health care provider.  Do not drive or use heavy  machinery while taking prescription pain medicine. Lifestyle  Do not use any products that contain nicotine or tobacco, such as cigarettes and e-cigarettes. If you need help quitting, ask your health care provider.  Limit alcohol intake to no more than 1 drink a day for nonpregnant women and 2 drinks a day for men. One drink  equals 12 oz of beer, 5 oz of wine, or 1 oz of hard liquor.  Get 7-9 hours of sleep each night, or the amount of sleep recommended by your health care provider.  Limit your stress. Talk with your health care provider if you need help with stress management.  Maintain a healthy weight. If you need help losing weight, ask your health care provider.  Exercise regularly. Aim for 150 minutes of moderate-intensity exercise (walking, biking, yoga) or 75 minutes of vigorous exercise (running, circuit training, swimming) each week. General instructions  Keep a journal to find out what triggers your migraine headaches so you can avoid these triggers. For example, write down: ? What you eat and drink. ? How much sleep you get. ? Any change to your diet or medicines.  Lie down in a dark, quiet room when you have a migraine.  Try placing a cool towel over your head when you have a migraine.  Keep lights dim, if bright lights bother you and make your migraines worse.  Keep all follow-up visits as told by your health care provider. This is important. Contact a health care provider if:  Your pain does not improve, even with medicine.  Your migraines continue to return, even with medicine.  You have a fever.  You have weight loss. Get help right away if:  Your migraine becomes severe and medicine does not help.  You have a stiff neck.  You have a loss of vision.  You have muscle weakness or loss of muscle control.  You start losing your balance or have trouble walking.  You feel faint or you pass out.  You develop new, severe symptoms.  You start having abrupt severe headaches that last for a second or less, like a thunderclap. Summary  Migraine headaches are usually stronger and more sudden than normal headaches (tension headaches). Migraines are characterized by an intense pulsing, throbbing pain that is usually only present on one side of the head.  The exact cause of this  condition is not known. However, a migraine may be caused when nerves in the brain become irritated and release chemicals that cause inflammation of blood vessels.  Certain things may trigger migraines, such as changes to diet or sleeping patterns, smoking, certain foods, alcohol, stress, and certain medicines.  Sometimes, migraine headaches can cause nausea, vomiting, sensitivity to light and sound, and vision changes.  Migraines are often diagnosed based on your symptoms, medical history, and a physical exam. This information is not intended to replace advice given to you by your health care provider. Make sure you discuss any questions you have with your health care provider. Document Released: 11/25/2000 Document Revised: 12/13/2015 Document Reviewed: 12/13/2015 Elsevier Interactive Patient Education  [employer].

## 2016-08-20 LAB — THYROID ANTIBODIES
THYROID PEROXIDASE ANTIBODY: 1 [IU]/mL (ref <9)
Thyroglobulin Ab: <1 IU/mL (ref <2)

## 2016-09-10 ENCOUNTER — Other Ambulatory Visit: Payer: Self-pay

## 2016-09-22 ENCOUNTER — Encounter: Payer: BC Managed Care – PPO | Admitting: Internal Medicine

## 2016-10-12 ENCOUNTER — Other Ambulatory Visit: Payer: Self-pay | Admitting: Internal Medicine

## 2016-10-12 NOTE — Telephone Encounter (Signed)
Sent In a 30 day supply for blood pressure medication. Dr. Regis Bill wanted patient to f/u from last appointment in a month to f/u about blood pressure medication. Please help patient schedule.

## 2016-10-12 NOTE — Telephone Encounter (Signed)
Pt has been sch

## 2016-11-10 NOTE — Progress Notes (Signed)
Chief Complaint  Patient presents with  . Follow-up    HPI: Elizabeth Tanner 46 y.o. come in for FU HT meds   Follow-up on elevated blood pressure and medication.  She has been on losartan 50 and tolerating well     130/90   Taking today .   Not checking her own readings  Because of  Elevation induced by anxiety  "where she lives"   Had right foot   Medial Surgery June 28 and healing and  [facility]al[facility]. Doing well and pleased with surgery .   No new sx . Now no restrictions noted  Not exercising yet  ROS: See pertinent positives and negatives per HPI.  Past Medical History:  Diagnosis Date  . Depression    ? atypical  . Dysmenorrhea   . Fracture of fibula    rt  . Gluten intolerance    self reported  . History of nephrolithiasis   . History of tobacco use   . Hypertension   . Knee injury    fall on snow fracture hewitt  . Pneumonia 8 12    LLL -clear now  . Wears glasses     Family History  Problem Relation Age of Onset  . Alcohol abuse Unknown   . Thyroid disease Unknown   . Arthritis Unknown        Father has a fused spine uncle with DJD  . Heart attack Maternal Uncle 30       Electrical problem also maternal aunt  . Melanoma Cousin   . Other Maternal Aunt        68 pacer.     Social History   Social History  . Marital status: Single    Spouse name: N/A  . Number of children: N/A  . Years of education: N/A   Social History Main Topics  . Smoking status: Former Smoker    Packs/day: 1.50    Years: 8.00    Types: Cigarettes  . Smokeless tobacco: Never Used  . Alcohol use 0.0 oz/week     Comment: occ  . Drug use: No  . Sexual activity: Not Asked     Comment: is smoking a few cigs   Other Topics Concern  . 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                Outpatient Medications  Prior to Visit  Medication Sig Dispense Refill  . VITAMIN B COMPLEX-C PO Take 1 tablet by mouth once.    Marland Kitchen losartan (COZAAR) 50 MG tablet TAKE 1 TABLET BY MOUTH DAILY 30 tablet 0   No facility-administered medications prior to visit.      EXAM:  BP 130/84 (BP Location: Right Arm, Cuff Size: Large)   Pulse 89   Temp 98.4 F (36.9 C) (Oral)   Wt 180 lb 12.8 oz (82 kg)   BMI 36.52 kg/m   Body mass index is 36.52 kg/m.  GENERAL: vitals reviewed and listed above, alert, oriented, appears well hydrated and in no acute distress HEENT: atraumatic, conjunctiva  clear, no obvious abnormalities on inspection of external nose and ears OP : no lesion edema or exudate  NECK: no obvious masses on inspection palpation  LUNGS: clear to auscultation bilaterally,  no wheezes, rales or rhonchi, good air movement CV: HRRR, no clubbing cyanosis or  peripheral edema nl cap refill  MS: moves all extremities without noticeable focal  abnormality PSYCH: pleasant and cooperative, no obvious depression or anxiety Lab Results  Component Value Date   WBC 7.7 08/18/2016   HGB 14.9 08/18/2016   HCT 42.6 08/18/2016   PLT 158 08/18/2016   GLUCOSE 98 08/18/2016   CHOL 264 (H) 08/19/2016   TRIG 189.0 (H) 08/19/2016   HDL 45.90 08/19/2016   LDLDIRECT 178.5 12/06/2013   LDLCALC 180 (H) 08/19/2016   ALT 35 08/18/2016   AST 26 08/18/2016   NA 139 08/18/2016   K 3.8 08/18/2016   CL 100 (L) 08/18/2016   CREATININE 0.65 08/18/2016   BUN 14 08/18/2016   CO2 27 08/18/2016   TSH 2.06 08/19/2016   HGBA1C 5.5 08/19/2016   BP Readings from Last 3 Encounters:  11/11/16 130/84  08/19/16 (!) 130/91  08/18/16 (!) 160/116  The 10-year ASCVD risk score [name] DC Jr., et al., 2013) is: 2.5%   Values used to calculate the score:     Age: 82 years     Sex: Female     Is Non-Hispanic African American: No     Diabetic: No     Tobacco smoker: No     Systolic Blood Pressure: 741 mmHg     Is BP treated: Yes     HDL  Cholesterol: 45.9 mg/dL     Total Cholesterol: 264 mg/dL Wt Readings from Last 3 Encounters:  11/11/16 180 lb 12.8 oz (82 kg)  08/19/16 182 lb 6.4 oz (82.7 kg)  08/18/16 180 lb (81.6 kg)     ASSESSMENT AND PLAN:  Discussed the following assessment and plan:  Hypertension, unspecified type  Medication management Dietary  And activity  level reviewed and strategies disc     Small steps  First goal 5# then 10# . Will help bp  At this time stay on same dose and use lsi  . Use the [employer]   As a good Event organiser. Dash eating etc  ROV in 4 mos Shared Decision Making Decline flu vaccine today  -Patient advised to return or notify health care team  if  new concerns arise.  Patient Instructions  Continue same  meds .   As discussed   Work on the 5 pounds as we said .  Goal is 120/80  Range for  long term heart health .        Standley Brooking. Vasiliy Mccarry M.D.

## 2016-11-11 ENCOUNTER — Encounter: Payer: Self-pay | Admitting: Internal Medicine

## 2016-11-11 ENCOUNTER — Ambulatory Visit (INDEPENDENT_AMBULATORY_CARE_PROVIDER_SITE_OTHER): Payer: BC Managed Care – PPO | Admitting: Internal Medicine

## 2016-11-11 VITALS — BP 130/84 | HR 89 | Temp 98.4°F | Wt 180.8 lb

## 2016-11-11 DIAGNOSIS — I10 Essential (primary) hypertension: Secondary | ICD-10-CM | POA: Diagnosis not present

## 2016-11-11 DIAGNOSIS — Z79899 Other long term (current) drug therapy: Secondary | ICD-10-CM

## 2016-11-11 MED ORDER — LOSARTAN POTASSIUM 50 MG PO TABS: 50.0000 mg | ORAL_TABLET | Freq: Every day | ORAL | 1 refills | Status: DC

## 2016-11-11 NOTE — Patient Instructions (Signed)
Continue same  meds .   As discussed   Work on the 5 pounds as we said .  Goal is 120/80  Range for  long term heart health .

## 2016-12-04 ENCOUNTER — Encounter: Payer: Self-pay | Admitting: Internal Medicine

## 2017-02-12 ENCOUNTER — Encounter: Payer: Self-pay | Admitting: Internal Medicine

## 2017-02-12 ENCOUNTER — Ambulatory Visit: Payer: BC Managed Care – PPO | Admitting: Internal Medicine

## 2017-02-12 ENCOUNTER — Ambulatory Visit: Payer: BC Managed Care – PPO | Admitting: Family Medicine

## 2017-02-12 VITALS — BP 132/90 | HR 88 | Temp 98.1°F | Wt 178.4 lb

## 2017-02-12 DIAGNOSIS — J320 Chronic maxillary sinusitis: Secondary | ICD-10-CM | POA: Diagnosis not present

## 2017-02-12 MED ORDER — DOXYCYCLINE HYCLATE 100 MG PO CAPS: 100.0000 mg | ORAL_CAPSULE | Freq: Two times a day (BID) | ORAL | 0 refills | Status: DC

## 2017-02-12 NOTE — Progress Notes (Signed)
Chief Complaint  Patient presents with  . Sinus Problem    gum pain x 1 week. Pt notes tenderness in neck and right side head radiating from clavicle to top of her head. Pt using sinus rinses and advil cold and sinus with not much relief. Pt is now blowing blood from sinuses x 5 days.     HPI: Elizabeth Tanner 46 y.o.  sda    Thinks she has sinus infection  Not responding to  measrues   Right side of face   Tender  Face and some frontal  advil cold and sinus  And saline    Blood  Discharge r nostril  an  Not getting better  .  This am sneezing and coughing and  No fever chills   Gums still hurts . Despite rx   Minimal cough at this time   Remote hx of sinusitis   No recent antibiotic   ROS: See pertinent positives and negatives per HPI.  Past Medical History:  Diagnosis Date  . Depression    ? atypical  . Dysmenorrhea   . Fracture of fibula    rt  . Gluten intolerance    self reported  . History of nephrolithiasis   . History of tobacco use   . Hypertension   . Knee injury    fall on snow fracture hewitt  . Pneumonia 8 12    LLL -clear now  . Wears glasses     Family History  Problem Relation Age of Onset  . Alcohol abuse Unknown   . Thyroid disease Unknown   . Arthritis Unknown        Father has a fused spine uncle with DJD  . Heart attack Maternal Uncle 30       Electrical problem also maternal aunt  . Melanoma Cousin   . Other Maternal Aunt        68 pacer.     Social History   Socioeconomic History  . Marital status: Single    Spouse name: None  . Number of children: None  . Years of education: None  . Highest education level: None  Social Needs  . Financial resource strain: None  . Food insecurity - worry: None  . Food insecurity - inability: None  . Transportation needs - medical: None  . Transportation needs - non-medical: None  Occupational History  . None  Tobacco Use  . Smoking status: Former Smoker    Packs/day: 1.50    Years: 8.00   Pack years: 12.00    Types: Cigarettes  . Smokeless tobacco: Never Used  Substance and Sexual Activity  . Alcohol use: Yes    Alcohol/week: 0.0 oz    Comment: occ  . Drug use: No  . Sexual activity: None    Comment: is smoking a few cigs  Other Topics Concern  . 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                Outpatient Medications Prior to Visit  Medication Sig Dispense Refill  . losartan (COZAAR) 50 MG tablet Take 1 tablet (50 mg total) by mouth daily. 90 tablet 1  . VITAMIN B COMPLEX-C PO Take 1 tablet by mouth once.  No facility-administered medications prior to visit.      EXAM:  BP 132/90 (BP Location: Right Arm, Patient Position: Sitting, Cuff Size: Normal)   Pulse 88   Temp 98.1 F (36.7 C) (Oral)   Wt 178 lb 6.4 oz (80.9 kg)   BMI 36.03 kg/m   Body mass index is 36.03 kg/m.  GENERAL: vitals reviewed and listed above, alert, oriented, appears well hydrated and in no acute distress congested  HEENT: atraumatic, conjunctiva  clear, no obvious abnormalities on inspection of external nose and ears tmx clear  Face tender maxilla right OP : no lesion edema or exudate   Mild redness  NECK: no obvious masses on inspection palpation  LUNGS: clear to auscultation bilaterally, no wheezes, rales or rhonchi, good air movement CV: HRRR, no clubbing cyanosis or  peripheral edema nl cap refill  MS: moves all extremities without noticeable focal  abnormality PSYCH: pleasant and cooperative, no obvious depression or anxiety Wt Readings from Last 3 Encounters:  02/12/17 178 lb 6.4 oz (80.9 kg)  11/11/16 180 lb 12.8 oz (82 kg)  08/19/16 182 lb 6.4 oz (82.7 kg)    ASSESSMENT AND PLAN:  Discussed the following assessment and plan:  Right maxillary sinusitis   Expectant management. Ongoing  Sx  Ok to  add antibiotic at this time  Focal sx  -Patient advised to return or notify health care team  if symptoms worsen ,persist or new concerns arise.  Patient Instructions  Antibiotic may help.   Continue sinus hygiene .     Sinusitis, Adult Sinusitis is soreness and inflammation of your sinuses. Sinuses are hollow spaces in the bones around your face. Your sinuses are located:  Around your eyes.  In the middle of your forehead.  Behind your nose.  In your cheekbones.  Your sinuses and nasal passages are lined with a stringy fluid (mucus). Mucus normally drains out of your sinuses. When your nasal tissues become inflamed or swollen, the mucus can become trapped or blocked so air cannot flow through your sinuses. This allows bacteria, viruses, and funguses to grow, which leads to infection. Sinusitis can develop quickly and last for 7?10 days (acute) or for more than 12 weeks (chronic). Sinusitis often develops after a cold. What are the causes? This condition is caused by anything that creates swelling in the sinuses or stops mucus from draining, including:  Allergies.  Asthma.  Bacterial or viral infection.  Abnormally shaped bones between the nasal passages.  Nasal growths that contain mucus (nasal polyps).  Narrow sinus openings.  Pollutants, such as chemicals or irritants in the air.  A foreign object stuck in the nose.  A fungal infection. This is rare.  What increases the risk? The following factors may make you more likely to develop this condition:  Having allergies or asthma.  Having had a recent cold or respiratory tract infection.  Having structural deformities or blockages in your nose or sinuses.  Having a weak immune system.  Doing a lot of swimming or diving.  Overusing nasal sprays.  Smoking.  What are the signs or symptoms? The main symptoms of this condition are pain and a feeling of pressure around the affected sinuses. Other symptoms  include:  Upper toothache.  Earache.  Headache.  Bad breath.  Decreased sense of smell and taste.  A cough that may get worse at night.  Fatigue.  Fever.  Thick drainage from your nose. The drainage is often green and it may contain pus (purulent).  Stuffy nose or congestion.  Postnasal drip. This is when extra mucus collects in the throat or back of the nose.  Swelling and warmth over the affected sinuses.  Sore throat.  Sensitivity to light.  How is this diagnosed? This condition is diagnosed based on symptoms, a medical history, and a physical exam. To find out if your condition is acute or chronic, your health care provider may:  Look in your nose for signs of nasal polyps.  Tap over the affected sinus to check for signs of infection.  View the inside of your sinuses using an imaging device that has a light attached (endoscope).  If your health care provider suspects that you have chronic sinusitis, you may also:  Be tested for allergies.  Have a sample of mucus taken from your nose (nasal culture) and checked for bacteria.  Have a mucus sample examined to see if your sinusitis is related to an allergy.  If your sinusitis does not respond to treatment and it lasts longer than 8 weeks, you may have an MRI or CT scan to check your sinuses. These scans also help to determine how severe your infection is. In rare cases, a bone biopsy may be done to rule out more serious types of fungal sinus disease. How is this treated? Treatment for sinusitis depends on the cause and whether your condition is chronic or acute. If a virus is causing your sinusitis, your symptoms will go away on their own within 10 days. You may be given medicines to relieve your symptoms, including:  Topical nasal decongestants. They shrink swollen nasal passages and let mucus drain from your sinuses.  Antihistamines. These drugs block inflammation that is triggered by allergies. This can help  to ease swelling in your nose and sinuses.  Topical nasal corticosteroids. These are nasal sprays that ease inflammation and swelling in your nose and sinuses.  Nasal saline washes. These rinses can help to get rid of thick mucus in your nose.  If your condition is caused by bacteria, you will be given an antibiotic medicine. If your condition is caused by a fungus, you will be given an antifungal medicine. Surgery may be needed to correct underlying conditions, such as narrow nasal passages. Surgery may also be needed to remove polyps. Follow these instructions at home: Medicines  Take, use, or apply over-the-counter and prescription medicines only as told by your health care provider. These may include nasal sprays.  If you were prescribed an antibiotic medicine, take it as told by your health care provider. Do not stop taking the antibiotic even if you start to feel better. Hydrate and Humidify  Drink enough water to keep your urine clear or pale yellow. Staying hydrated will help to thin your mucus.  Use a cool mist humidifier to keep the humidity level in your home above 50%.  Inhale steam for 10-15 minutes, 3-4 times a day or as told by your health care provider. You can do this in the bathroom while a hot shower is running.  Limit your exposure to cool or dry air. Rest  Rest as much as possible.  Sleep with your head raised (elevated).  Make sure to get enough sleep each night. General instructions  Apply a warm, moist washcloth to your face 3-4 times a day or as told by your health care provider. This will help with discomfort.  Wash your hands often with soap and water to reduce your exposure to viruses and other germs. If soap and  water are not available, use hand sanitizer.  Do not smoke. Avoid being around people who are smoking (secondhand smoke).  Keep all follow-up visits as told by your health care provider. This is important. Contact a health care provider  if:  You have a fever.  Your symptoms get worse.  Your symptoms do not improve within 10 days. Get help right away if:  You have a severe headache.  You have persistent vomiting.  You have pain or swelling around your face or eyes.  You have vision problems.  You develop confusion.  Your neck is stiff.  You have trouble breathing. This information is not intended to replace advice given to you by your health care provider. Make sure you discuss any questions you have with your health care provider. Document Released: 03/02/2005 Document Revised: 10/27/2015 Document Reviewed: 12/26/2014 Elsevier Interactive Patient Education  2017 Stigler K. Panosh M.D.

## 2017-02-12 NOTE — Patient Instructions (Addendum)
Antibiotic may help.   Continue sinus hygiene .     Sinusitis, Adult Sinusitis is soreness and inflammation of your sinuses. Sinuses are hollow spaces in the bones around your face. Your sinuses are located:  Around your eyes.  In the middle of your forehead.  Behind your nose.  In your cheekbones.  Your sinuses and nasal passages are lined with a stringy fluid (mucus). Mucus normally drains out of your sinuses. When your nasal tissues become inflamed or swollen, the mucus can become trapped or blocked so air cannot flow through your sinuses. This allows bacteria, viruses, and funguses to grow, which leads to infection. Sinusitis can develop quickly and last for 7?10 days (acute) or for more than 12 weeks (chronic). Sinusitis often develops after a cold. What are the causes? This condition is caused by anything that creates swelling in the sinuses or stops mucus from draining, including:  Allergies.  Asthma.  Bacterial or viral infection.  Abnormally shaped bones between the nasal passages.  Nasal growths that contain mucus (nasal polyps).  Narrow sinus openings.  Pollutants, such as chemicals or irritants in the air.  A foreign object stuck in the nose.  A fungal infection. This is rare.  What increases the risk? The following factors may make you more likely to develop this condition:  Having allergies or asthma.  Having had a recent cold or respiratory tract infection.  Having structural deformities or blockages in your nose or sinuses.  Having a weak immune system.  Doing a lot of swimming or diving.  Overusing nasal sprays.  Smoking.  What are the signs or symptoms? The main symptoms of this condition are pain and a feeling of pressure around the affected sinuses. Other symptoms include:  Upper toothache.  Earache.  Headache.  Bad breath.  Decreased sense of smell and taste.  A cough that may get worse at night.  Fatigue.  Fever.  Thick  drainage from your nose. The drainage is often green and it may contain pus (purulent).  Stuffy nose or congestion.  Postnasal drip. This is when extra mucus collects in the throat or back of the nose.  Swelling and warmth over the affected sinuses.  Sore throat.  Sensitivity to light.  How is this diagnosed? This condition is diagnosed based on symptoms, a medical history, and a physical exam. To find out if your condition is acute or chronic, your health care provider may:  Look in your nose for signs of nasal polyps.  Tap over the affected sinus to check for signs of infection.  View the inside of your sinuses using an imaging device that has a light attached (endoscope).  If your health care provider suspects that you have chronic sinusitis, you may also:  Be tested for allergies.  Have a sample of mucus taken from your nose (nasal culture) and checked for bacteria.  Have a mucus sample examined to see if your sinusitis is related to an allergy.  If your sinusitis does not respond to treatment and it lasts longer than 8 weeks, you may have an MRI or CT scan to check your sinuses. These scans also help to determine how severe your infection is. In rare cases, a bone biopsy may be done to rule out more serious types of fungal sinus disease. How is this treated? Treatment for sinusitis depends on the cause and whether your condition is chronic or acute. If a virus is causing your sinusitis, your symptoms will go away on their  own within 10 days. You may be given medicines to relieve your symptoms, including:  Topical nasal decongestants. They shrink swollen nasal passages and let mucus drain from your sinuses.  Antihistamines. These drugs block inflammation that is triggered by allergies. This can help to ease swelling in your nose and sinuses.  Topical nasal corticosteroids. These are nasal sprays that ease inflammation and swelling in your nose and sinuses.  Nasal saline  washes. These rinses can help to get rid of thick mucus in your nose.  If your condition is caused by bacteria, you will be given an antibiotic medicine. If your condition is caused by a fungus, you will be given an antifungal medicine. Surgery may be needed to correct underlying conditions, such as narrow nasal passages. Surgery may also be needed to remove polyps. Follow these instructions at home: Medicines  Take, use, or apply over-the-counter and prescription medicines only as told by your health care provider. These may include nasal sprays.  If you were prescribed an antibiotic medicine, take it as told by your health care provider. Do not stop taking the antibiotic even if you start to feel better. Hydrate and Humidify  Drink enough water to keep your urine clear or pale yellow. Staying hydrated will help to thin your mucus.  Use a cool mist humidifier to keep the humidity level in your home above 50%.  Inhale steam for 10-15 minutes, 3-4 times a day or as told by your health care provider. You can do this in the bathroom while a hot shower is running.  Limit your exposure to cool or dry air. Rest  Rest as much as possible.  Sleep with your head raised (elevated).  Make sure to get enough sleep each night. General instructions  Apply a warm, moist washcloth to your face 3-4 times a day or as told by your health care provider. This will help with discomfort.  Wash your hands often with soap and water to reduce your exposure to viruses and other germs. If soap and water are not available, use hand sanitizer.  Do not smoke. Avoid being around people who are smoking (secondhand smoke).  Keep all follow-up visits as told by your health care provider. This is important. Contact a health care provider if:  You have a fever.  Your symptoms get worse.  Your symptoms do not improve within 10 days. Get help right away if:  You have a severe headache.  You have persistent  vomiting.  You have pain or swelling around your face or eyes.  You have vision problems.  You develop confusion.  Your neck is stiff.  You have trouble breathing. This information is not intended to replace advice given to you by your health care provider. Make sure you discuss any questions you have with your health care provider. Document Released: 03/02/2005 Document Revised: 10/27/2015 Document Reviewed: 12/26/2014 Elsevier Interactive Patient Education  2017 [employer].

## 2017-11-29 ENCOUNTER — Ambulatory Visit: Payer: Self-pay | Admitting: Internal Medicine

## 2017-11-29 ENCOUNTER — Ambulatory Visit (HOSPITAL_COMMUNITY)
Admission: EM | Admit: 2017-11-29 | Discharge: 2017-11-29 | Disposition: A | Discharge status: Home / Self Care | Payer: BC Managed Care – PPO | Attending: Family Medicine | Admitting: Family Medicine

## 2017-11-29 ENCOUNTER — Encounter (HOSPITAL_COMMUNITY): Payer: Self-pay | Admitting: Emergency Medicine

## 2017-11-29 DIAGNOSIS — J320 Chronic maxillary sinusitis: Secondary | ICD-10-CM | POA: Diagnosis not present

## 2017-11-29 MED ORDER — DOXYCYCLINE HYCLATE 100 MG PO CAPS: 100.0000 mg | ORAL_CAPSULE | Freq: Two times a day (BID) | ORAL | 0 refills | Status: DC

## 2017-11-29 NOTE — ED Provider Notes (Signed)
Elizabeth Tanner    CSN: [id number] Arrival date & time: 11/29/17  2505     History   Chief Complaint Chief Complaint  Patient presents with  . Cough    HPI Elizabeth Tanner is a 47 y.o. female.  3-day history of productive cough.  There is low-grade fever.  Cough is productive of dark sputum.  When asked this seemed to be coming from chest or head patient thinks is probably in her sinuses as she also has some pressure and pain in her sinuses below her eyes as well as gum pain.  HPI  Past Medical History:  Diagnosis Date  . Depression    ? atypical  . Dysmenorrhea   . Fracture of fibula    rt  . Gluten intolerance    self reported  . History of nephrolithiasis   . History of tobacco use   . Hypertension   . Knee injury    fall on snow fracture [place]  . Pneumonia 8 12    LLL -clear now  . Wears glasses     Patient Active Problem List   Diagnosis Date Noted  . History of nephrolithiasis   . Visit for preventive health examination 12/13/2013  . Hyperglycemia 12/13/2013  . Amenorrhea 12/13/2013  . Sinusitis 02/06/2013  . Encounter for management and injection of injectable progestin contraceptive 08/16/2011  . LFTs abnormal minor alt 08/16/2011  . Hypertension 03/18/2011  . Stress at work 03/18/2011  . Hair loss 12/26/2010  . Thyroiditis 12/26/2010  . Dyspnea 11/06/2010  . History of tobacco use 11/06/2010  . Other and unspecified hyperlipidemia 07/31/2010  . Preventative health care 07/30/2010  . Elevated blood pressure reading 07/30/2010  . Depo-Provera contraceptive status 07/16/2010  . 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 NONSPECIFIC ABNORM RESULTS THYROID FUNCT STUDY 11/22/2008  . MYALGIA 11/13/2008  . FATIGUE 11/13/2008  . OBESITY 07/23/2008  . NUMBNESS, ARM 09/12/2007  . NECK PAIN 08/31/2007  . ARM PAIN, RIGHT 08/31/2007  . SLEEPLESSNESS 02/28/2007  . DEPRESSION 11/05/2006  . DYSMENORRHEA 11/05/2006  . IRREGULAR  MENSTRUATION 11/05/2006  . ATTENTION DEFICIT DISORDER, HX OF 11/05/2006    Past Surgical History:  Procedure Laterality Date  . BREAST REDUCTION SURGERY  2001  . MOUTH SURGERY    . ORIF TIBIA PLATEAU Right 05/05/2012   Procedure: OPEN REDUCTION INTERNAL FIXATION TIBIAL PLATEAU;  Surgeon: Wylene Simmer, MD;  Location: [facility];  Service: Orthopedics;  Laterality: Right;    OB History   None      Home Medications    Prior to Admission medications   Medication Sig Start Date End Date Taking? Authorizing Provider  doxycycline (VIBRAMYCIN) 100 MG capsule Take 1 capsule (100 mg total) by mouth 2 (two) times daily. For sinusitis Patient not taking: Reported on 11/29/2017 02/12/17   Panosh, Standley Brooking, MD  losartan (COZAAR) 50 MG tablet Take 1 tablet (50 mg total) by mouth daily. Patient not taking: Reported on 11/29/2017 11/11/16   Panosh, Standley Brooking, MD  VITAMIN B COMPLEX-C PO Take 1 tablet by mouth once.    [provider]    Family History Family History  Problem Relation Age of Onset  . Alcohol abuse Unknown   . Thyroid disease Unknown   . Arthritis Unknown        Father has a fused spine uncle with DJD  . Heart attack Maternal Uncle 30       Electrical problem also maternal aunt  .  Melanoma Cousin   . Other Maternal Aunt        68 pacer.     Social History Social History   Tobacco Use  . Smoking status: Former Smoker    Packs/day: 1.50    Years: 8.00    Pack years: 12.00    Types: Cigarettes  . Smokeless tobacco: Never Used  Substance Use Topics  . Alcohol use: Yes    Alcohol/week: 0.0 standard drinks    Comment: occ  . Drug use: No     Allergies   Azithromycin; Amoxicillin; Gluten; Vicodin [hydrocodone-acetaminophen]; and Penicillins   Review of Systems Review of Systems  Constitutional: Negative.  Negative for chills and fever.  HENT: Positive for congestion and sinus pressure. Negative for ear pain and sore throat.   Eyes: Negative  for pain and visual disturbance.  Respiratory: Positive for cough. Negative for shortness of breath.   Cardiovascular: Negative for chest pain and palpitations.  Gastrointestinal: Negative for abdominal pain and vomiting.  Genitourinary: Negative for dysuria and hematuria.  Musculoskeletal: Negative for arthralgias and back pain.  Skin: Negative for color change and rash.  Neurological: Negative for seizures and syncope.  All other systems reviewed and are negative.    Physical Exam Triage Vital Signs ED Triage Vitals [11/29/17 1112]  Enc Vitals Group     BP (!) 155/106     Pulse Rate 97     Resp 16     Temp 98.1 F (36.7 C)     Temp src      SpO2 97 %     Weight      Height      Head Circumference      Peak Flow      Pain Score      Pain Loc      Pain Edu?      Excl. in [place]?    No data found.  Updated Vital Signs BP (!) 155/106   Pulse 97   Temp 98.1 F (36.7 C)   Resp 16   SpO2 97%   Visual Acuity Right Eye Distance:   Left Eye Distance:   Bilateral Distance:    Right Eye Near:   Left Eye Near:    Bilateral Near:     Physical Exam  Constitutional: She appears well-developed and well-nourished.  HENT:  Head: Normocephalic.  Right Ear: External ear normal.  Left Ear: External ear normal.  There is tenderness to palpation and percussion over the maxillary sinuses  Cardiovascular: Normal rate, regular rhythm and normal heart sounds.  Pulmonary/Chest: Effort normal and breath sounds normal. She has no wheezes. She has no rales.     UC Treatments / Results  Labs (all labs ordered are listed, but only abnormal results are displayed) Labs Reviewed - No data to display  EKG None  Radiology No results found.  Procedures Procedures (including critical care time)  Medications Ordered in UC Medications - No data to display  Initial Impression / Assessment and Plan / UC Course  I have reviewed the triage vital signs and the nursing  notes.  Pertinent labs & imaging results that were available during my care of the patient were reviewed by me and considered in my medical decision making (see chart for details).     Sinusitis with productive cough Final Clinical Impressions(s) / UC Diagnoses   Final diagnoses:  None   Discharge Instructions   None    ED Prescriptions    None  Controlled Substance Prescriptions [place] Controlled Substance Registry consulted? No   Wardell Honour, MD 11/29/17 [phone]

## 2017-11-29 NOTE — ED Triage Notes (Signed)
Pt c/o cough since Friday with rib soreness.

## 2017-11-29 NOTE — Telephone Encounter (Signed)
Noted  

## 2017-11-29 NOTE — Telephone Encounter (Signed)
Pt called to c/o low grade fever, chest feels "tight" and having worsening wheezing. Pt with productive cough with dark yellow sputum. Pt coughing frequently during triage call. Cough sounds very congested. Pt stated she has no appetite and no energy. Pt also c/o runny nose and cannot lay flat.  Due to wheezing and worsening symptoms over the weekend. Pt advised to go to [facility]. Care advice given and pt verbalized understanding. Pt stated she will be going to [facility] urgent care.   Reason for Disposition . Wheezing is present  Answer Assessment - Initial Assessment Questions 1. ONSET: "When did the cough begin?"      Last Friday 2. SEVERITY: "How bad is the cough today?"      Frequent if she stays still she doesn't cough and if she talks she coughs  3. RESPIRATORY DISTRESS: "Describe your breathing."      Shortness of breath 4. FEVER: "Do you have a fever?" If so, ask: "What is your temperature, how was it measured, and when did it start?"     99.9 2 hours ago oral thermometer 5. SPUTUM: "Describe the color of your sputum" (clear, white, yellow, green)     Dark yellow 6. HEMOPTYSIS: "Are you coughing up any blood?" If so ask: "How much?" (flecks, streaks, tablespoons, etc.)     no 7. CARDIAC HISTORY: "Do you have any history of heart disease?" (e.g., heart attack, congestive heart failure)      no 8. LUNG HISTORY: "Do you have any history of lung disease?"  (e.g., pulmonary embolus, asthma, emphysema)     H/o pneumonia 9. PE RISK FACTORS: "Do you have a history of blood clots?" (or: recent major surgery, recent prolonged travel, bedridden)     no 10. OTHER SYMPTOMS: "Do you have any other symptoms?" (e.g., runny nose, wheezing, chest pain)       Runny nose, wheezing intermittently cant lay flat, chest feels tight 11. PREGNANCY: "Is there any chance you are pregnant?" "When was your last menstrual period?"       No LMP: 2014 12. TRAVEL: "Have you traveled out of the country in the last  month?" (e.g., travel history, exposures)       no  Protocols used: [employer]

## 2017-11-29 NOTE — Discharge Instructions (Signed)
Continue taking Mucinex DM for symptomatic relief

## 2018-02-06 ENCOUNTER — Encounter (HOSPITAL_COMMUNITY): Payer: Self-pay | Admitting: Emergency Medicine

## 2018-02-06 ENCOUNTER — Emergency Department (HOSPITAL_COMMUNITY): Payer: BC Managed Care – PPO

## 2018-02-06 ENCOUNTER — Other Ambulatory Visit: Payer: Self-pay

## 2018-02-06 ENCOUNTER — Emergency Department (HOSPITAL_COMMUNITY)
Admission: EM | Admit: 2018-02-06 | Discharge: 2018-02-06 | Disposition: A | Discharge status: Home / Self Care | Payer: BC Managed Care – PPO | Attending: Emergency Medicine | Admitting: Emergency Medicine

## 2018-02-06 DIAGNOSIS — M25561 Pain in right knee: Secondary | ICD-10-CM

## 2018-02-06 DIAGNOSIS — I10 Essential (primary) hypertension: Secondary | ICD-10-CM | POA: Insufficient documentation

## 2018-02-06 DIAGNOSIS — S8991XA Unspecified injury of right lower leg, initial encounter: Secondary | ICD-10-CM | POA: Diagnosis present

## 2018-02-06 DIAGNOSIS — W010XXA Fall on same level from slipping, tripping and stumbling without subsequent striking against object, initial encounter: Secondary | ICD-10-CM | POA: Diagnosis not present

## 2018-02-06 DIAGNOSIS — Y9389 Activity, other specified: Secondary | ICD-10-CM | POA: Insufficient documentation

## 2018-02-06 DIAGNOSIS — Z87891 Personal history of nicotine dependence: Secondary | ICD-10-CM | POA: Diagnosis not present

## 2018-02-06 DIAGNOSIS — Y92009 Unspecified place in unspecified non-institutional (private) residence as the place of occurrence of the external cause: Secondary | ICD-10-CM | POA: Insufficient documentation

## 2018-02-06 DIAGNOSIS — Y998 Other external cause status: Secondary | ICD-10-CM | POA: Diagnosis not present

## 2018-02-06 LAB — I-STAT CHEM 8, ED
BUN: 17 mg/dL (ref 6–20)
CALCIUM ION: 1.16 mmol/L (ref 1.15–1.40)
Chloride: 107 mmol/L (ref 98–111)
Creatinine, Ser: 0.7 mg/dL (ref 0.44–1.00)
GLUCOSE: 112 mg/dL — AB (ref 70–99)
HCT: 43 % (ref 36.0–46.0)
Hemoglobin: 14.6 g/dL (ref 12.0–15.0)
Potassium: 3.8 mmol/L (ref 3.5–5.1)
Sodium: 140 mmol/L (ref 135–145)
TCO2: 24 mmol/L (ref 22–32)

## 2018-02-06 MED ORDER — TRAMADOL HCL 50 MG PO TABS: 50.0000 mg | ORAL_TABLET | Freq: Four times a day (QID) | ORAL | 0 refills | Status: DC | PRN

## 2018-02-06 MED ORDER — LISINOPRIL 5 MG PO TABS: 5.0000 mg | ORAL_TABLET | Freq: Every day | ORAL | 0 refills | Status: DC

## 2018-02-06 MED ORDER — OXYCODONE-ACETAMINOPHEN 5-325 MG PO TABS
1.0000 | ORAL_TABLET | Freq: Once | ORAL | Status: AC
  Administered 2018-02-06: 1 via ORAL
  Filled 2018-02-06: qty 1

## 2018-02-06 MED ORDER — HYDROCHLOROTHIAZIDE 25 MG PO TABS: 25.0000 mg | ORAL_TABLET | Freq: Every day | ORAL | 0 refills | Status: DC

## 2018-02-06 MED ORDER — PREDNISONE 10 MG PO TABS: 40.0000 mg | ORAL_TABLET | Freq: Every day | ORAL | 0 refills | Status: DC

## 2018-02-06 NOTE — ED Provider Notes (Signed)
[facility] DEPT Provider Note   CSN: [id number] Arrival date & time: 02/06/18  1128     History   Chief Complaint Chief Complaint  Patient presents with  . Knee Injury    HPI Elizabeth Tanner is a 47 y.o. female who presents to the ED with right knee pain s/p fall this morning. Patient reports she slipped and fell in the kitchen and landed on her right knee. C/o pain and swelling.  Hx of patellar fracture that required surgery and "building up" the patella. Patient using a cane with ambulation. She applied ice but has not had any medication for pain.   The history is provided by the patient. No language interpreter was used.  Knee Pain   This is a new problem. The current episode started 1 to 2 hours ago. The problem occurs constantly. The problem has been gradually worsening. The pain is present in the right knee. The pain is severe. She has tried cold for the symptoms. The treatment provided mild relief. There has been a history of trauma.    Past Medical History:  Diagnosis Date  . Depression    ? atypical  . Dysmenorrhea   . Fracture of fibula    rt  . Gluten intolerance    self reported  . History of nephrolithiasis   . History of tobacco use   . Hypertension   . Knee injury    fall on snow fracture hewitt  . Pneumonia 8 12    LLL -clear now  . Wears glasses     Patient Active Problem List   Diagnosis Date Noted  . History of nephrolithiasis   . Visit for preventive health examination 12/13/2013  . Hyperglycemia 12/13/2013  . Amenorrhea 12/13/2013  . Sinusitis 02/06/2013  . Encounter for management and injection of injectable progestin contraceptive 08/16/2011  . LFTs abnormal minor alt 08/16/2011  . Hypertension 03/18/2011  . Stress at work 03/18/2011  . Hair loss 12/26/2010  . Thyroiditis 12/26/2010  . Dyspnea 11/06/2010  . History of tobacco use 11/06/2010  . Other and unspecified hyperlipidemia 07/31/2010  .  Preventative health care 07/30/2010  . Elevated blood pressure reading 07/30/2010  . Depo-Provera contraceptive status 07/16/2010  . 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 NONSPECIFIC ABNORM RESULTS THYROID FUNCT STUDY 11/22/2008  . MYALGIA 11/13/2008  . FATIGUE 11/13/2008  . OBESITY 07/23/2008  . NUMBNESS, ARM 09/12/2007  . NECK PAIN 08/31/2007  . ARM PAIN, RIGHT 08/31/2007  . SLEEPLESSNESS 02/28/2007  . DEPRESSION 11/05/2006  . DYSMENORRHEA 11/05/2006  . IRREGULAR MENSTRUATION 11/05/2006  . ATTENTION DEFICIT DISORDER, HX OF 11/05/2006    Past Surgical History:  Procedure Laterality Date  . BREAST REDUCTION SURGERY  2001  . MOUTH SURGERY    . ORIF TIBIA PLATEAU Right 05/05/2012   Procedure: OPEN REDUCTION INTERNAL FIXATION TIBIAL PLATEAU;  Surgeon: Wylene Simmer, MD;  Location: [place];  Service: Orthopedics;  Laterality: Right;     OB History   None      Home Medications    Prior to Admission medications   Medication Sig Start Date End Date Taking? Authorizing Provider  doxycycline (VIBRAMYCIN) 100 MG capsule Take 1 capsule (100 mg total) by mouth 2 (two) times daily. For sinusitis 11/29/17   Wardell Honour, MD  hydrochlorothiazide (HYDRODIURIL) 25 MG tablet Take 1 tablet (25 mg total) by mouth daily. 02/06/18   Ashley Murrain, NP  lisinopril (PRINIVIL,ZESTRIL) 5 MG tablet Take  1 tablet (5 mg total) by mouth daily. 02/06/18   Ashley Murrain, NP  losartan (COZAAR) 50 MG tablet Take 1 tablet (50 mg total) by mouth daily. Patient not taking: Reported on 11/29/2017 11/11/16   Panosh, Standley Brooking, MD  predniSONE (DELTASONE) 10 MG tablet Take 4 tablets (40 mg total) by mouth daily with breakfast. 02/06/18   Ashley Murrain, NP  traMADol (ULTRAM) 50 MG tablet Take 1 tablet (50 mg total) by mouth every 6 (six) hours as needed. 02/06/18   Ashley Murrain, NP  VITAMIN B COMPLEX-C PO Take 1 tablet by mouth once.    [provider]    Family  History Family History  Problem Relation Age of Onset  . Alcohol abuse Unknown   . Thyroid disease Unknown   . Arthritis Unknown        Father has a fused spine uncle with DJD  . Heart attack Maternal Uncle 30       Electrical problem also maternal aunt  . Melanoma Cousin   . Other Maternal Aunt        68 pacer.     Social History Social History   Tobacco Use  . Smoking status: Former Smoker    Packs/day: 1.50    Years: 8.00    Pack years: 12.00    Types: Cigarettes  . Smokeless tobacco: Never Used  Substance Use Topics  . Alcohol use: Yes    Alcohol/week: 0.0 standard drinks    Comment: occ  . Drug use: No     Allergies   Azithromycin; Amoxicillin; Gluten; Vicodin [hydrocodone-acetaminophen]; and Penicillins   Review of Systems Review of Systems  Musculoskeletal: Positive for arthralgias.       Right knee injury  All other systems reviewed and are negative.    Physical Exam Updated Vital Signs BP (!) 171/123 (BP Location: Right Arm)   Pulse (!) 102   Temp 98.2 F (36.8 C) (Oral)   Resp 18   SpO2 99%   Physical Exam  Constitutional: She appears well-developed and well-nourished. No distress.  HENT:  Head: Normocephalic and atraumatic.  Eyes: EOM are normal.  Neck: Neck supple.  Cardiovascular: Tachycardia present.  Pulmonary/Chest: Effort normal.  Musculoskeletal:       Right knee: She exhibits swelling. She exhibits no laceration. Decreased range of motion: due to pain. Tenderness found. MCL, LCL and patellar tendon tenderness noted.  Neurological: She is alert.  Skin: Skin is warm and dry.  Psychiatric: She has a normal mood and affect. Her behavior is normal.  Nursing note and vitals reviewed.    ED Treatments / Results  Labs (all labs ordered are listed, but only abnormal results are displayed) Labs Reviewed  I-STAT CHEM 8, ED - Abnormal; Notable for the following components:      Result Value   Glucose, Bld 112 (*)    All other  components within normal limits    Radiology Dg Knee Complete 4 Views Right  Result Date: 02/06/2018 CLINICAL DATA:  Knee pain after fall this morning. EXAM: RIGHT KNEE - COMPLETE 4+ VIEW COMPARISON:  RIGHT knee radiograph April 27, 2012 FINDINGS: No acute fracture deformity or dislocation. Lateral tibial plateau plate and screw fixation, intact hardware without periprosthetic lucency. No residual fracture line. No destructive bony lesions. Mild tricompartmental marginal spurring. Moderate suprapatellar joint effusion. IMPRESSION: 1. No acute fracture deformity or dislocation. Moderate suprapatellar joint effusion. 2. Tibial ORIF without hardware failure. 3. Mild osteoarthrosis. Electronically Signed  By: Elon Alas M.D.   On: 02/06/2018 13:15    Procedures Procedures (including critical care time)  Medications Ordered in ED Medications  oxyCODONE-acetaminophen (PERCOCET/ROXICET) 5-325 MG per tablet 1 tablet (1 tablet Oral Given 02/06/18 1247)     Initial Impression / Assessment and Plan / ED Course  I have reviewed the triage vital signs and the nursing notes. 47 y.o. female here with right knee pain s/p fall stable for d/c without fracture or dislocation noted on x-ray and no damage to the hardware in the knee. Knee immobilizer applied, pain medication and ice while in the ED. I discussed with the patient her elevated BP and she states she is aware and that is it up due to her pain. We continued to monitor the BP and discussed with patient at d/c that we will start medications and she can f/u with her PCP this week for recheck and continued plan. Patient will also call her orthopedic doctor for follow up of her knee. I discussed this case with Dr. Sherry Ruffing.   Final Clinical Impressions(s) / ED Diagnoses   Final diagnoses:  Hypertension, unspecified type  Acute pain of right knee    ED Discharge Orders         Ordered    lisinopril (PRINIVIL,ZESTRIL) 5 MG tablet  Daily      02/06/18 1448    hydrochlorothiazide (HYDRODIURIL) 25 MG tablet  Daily     02/06/18 1448    traMADol (ULTRAM) 50 MG tablet  Every 6 hours PRN     02/06/18 1448    predniSONE (DELTASONE) 10 MG tablet  Daily with breakfast     02/06/18 1454           Debroah Baller [place], NP 02/06/18 2137    Jola Schmidt, MD 02/07/18 [phone]

## 2018-02-06 NOTE — Discharge Instructions (Addendum)
Follow up with your doctor for your high blood pressure and with your orthopedic doctor for your knee injury. Return here as needed.

## 2018-02-06 NOTE — ED Triage Notes (Signed)
Patient c/o right knee pain after fall this morning. Hx patella fracture. Ambulatory with cane.

## 2018-02-06 NOTE — ED Notes (Signed)
Patient transported to X-ray 

## 2018-02-06 NOTE — ED Notes (Signed)
Bed: WTR7 Expected date:  Expected time:  Means of arrival:  Comments: 

## 2018-08-02 ENCOUNTER — Encounter: Payer: Self-pay | Admitting: Internal Medicine

## 2018-08-02 ENCOUNTER — Other Ambulatory Visit: Payer: Self-pay

## 2018-08-02 ENCOUNTER — Ambulatory Visit (INDEPENDENT_AMBULATORY_CARE_PROVIDER_SITE_OTHER): Payer: BC Managed Care – PPO | Admitting: Internal Medicine

## 2018-08-02 DIAGNOSIS — R1011 Right upper quadrant pain: Secondary | ICD-10-CM

## 2018-08-02 DIAGNOSIS — Z8679 Personal history of other diseases of the circulatory system: Secondary | ICD-10-CM | POA: Diagnosis not present

## 2018-08-02 NOTE — Progress Notes (Signed)
Virtual Visit via Video Note  I connected with@ on 08/02/18 at 11:00 AM EDT by a video enabled telemedicine application and verified that I am speaking with the correct person using two identifiers. Location patient: home Location provider:work  office Persons participating in the virtual visit: patient, provider  [place] national recommendations  regarding COVID 19 pandemic   video visit is advised over in office visit for this patient.  Patient aware  of the limitations of evaluation and management by telemedicine and  availability of in person appointments. and agreed to proceed.   HPI: Elizabeth Tanner presents for video visit  SDA   For  " gall bladder issues"  Co months of post prandial RUQ flank pain abd pain with nausea no vomiting  Pos burping  She has altered her diet and still happening . No bopwel changes   No fever  No rx  Not taking  An acid blocker cause she" doesn't take that  Kind of stufff"  Occasional cigarette  coffee in am etoh 1-2 couple times per week Has low 10 # unintentionally  From this   ROS: See pertinent positives and negatives per HPI. Last visit with me nov 2018  Has gon off all bp meds cause  She had lost weight  And hasn't checked since    But c" can feel when up"  Non baseline checking at this time  Was given  Bp ,eds in ed in  2019 when she had knee pain  And readings were 171/123  Past Medical History:  Diagnosis Date  . Depression    ? atypical  . Dysmenorrhea   . Fracture of fibula    rt  . Gluten intolerance    self reported  . History of nephrolithiasis   . History of tobacco use   . Hypertension   . Knee injury    fall on snow fracture [place]  . Pneumonia 8 12    LLL -clear now  . Wears glasses     Past Surgical History:  Procedure Laterality Date  . BREAST REDUCTION SURGERY  2001  . MOUTH SURGERY    . ORIF TIBIA PLATEAU Right 05/05/2012   Procedure: OPEN REDUCTION INTERNAL FIXATION TIBIAL PLATEAU;  Surgeon: Wylene Simmer, MD;   Location: [place];  Service: Orthopedics;  Laterality: Right;    Family History  Problem Relation Age of Onset  . Alcohol abuse Unknown   . Thyroid disease Unknown   . Arthritis Unknown        Father has a fused spine uncle with DJD  . Heart attack Maternal Uncle 30       Electrical problem also maternal aunt  . Melanoma Cousin   . Other Maternal Aunt        68 pacer.     Social History   Tobacco Use  . Smoking status: Former Smoker    Packs/day: 1.50    Years: 8.00    Pack years: 12.00    Types: Cigarettes  . Smokeless tobacco: Never Used  Substance Use Topics  . Alcohol use: Yes    Alcohol/week: 0.0 standard drinks    Comment: occ  . Drug use: No      Current Outpatient Medications:  Marland Kitchen  VITAMIN B COMPLEX-C PO, Take 1 tablet by mouth once., Disp: , Rfl:   EXAM: BP Readings from Last 3 Encounters:  02/06/18 (!) 171/123  11/29/17 (!) 155/106  02/12/17 132/90    VITALS per patient if applicable:  GENERAL: alert, oriented, appears well and in no acute distress  HEENT: atraumatic, conjunttiva clear, no obvious abnormalities on inspection of external nose and ears  NECK: normal movements of the head and neck  LUNGS: on inspection no signs of respiratory distress, breathing rate appears normal, no obvious gross SOB, gasping or wheezing  CV: no obvious cyanosis Points to ruq pain andflank area of pain  But moves well  Non toxic  MS: moves all visible extremities without noticeable abnormality  PSYCH/NEURO: pleasant and cooperative, no obvious depression or anxiety, speech and thought processing grossly intact Lab Results  Component Value Date   WBC 7.7 08/18/2016   HGB 14.6 02/06/2018   HCT 43.0 02/06/2018   PLT 158 08/18/2016   GLUCOSE 112 (H) 02/06/2018   CHOL 264 (H) 08/19/2016   TRIG 189.0 (H) 08/19/2016   HDL 45.90 08/19/2016   LDLDIRECT 178.5 12/06/2013   LDLCALC 180 (H) 08/19/2016   ALT 35 08/18/2016   AST 26 08/18/2016   NA  140 02/06/2018   K 3.8 02/06/2018   CL 107 02/06/2018   CREATININE 0.70 02/06/2018   BUN 17 02/06/2018   CO2 27 08/18/2016   TSH 2.06 08/19/2016   HGBA1C 5.5 08/19/2016    ASSESSMENT AND PLAN:  Discussed the following assessment and plan:  Postprandial RUQ pain - Plan: US Abdomen Complete  History of hypertension - pat reporsts ol and off all meds but needs to check readings to confirm as has been elevated in past - Plan: US Abdomen Complete Plan Abd [place]   Biliary pain suspicious    Take pepcid in interim for poss underlying gastritis .  Tole her to take BP readings at rest  When she "doesn't feel it isi up"  To make sure is in range  As her bp reading in ed was very high albeit  Had acute pain and stress  Plan same altered diet    consdier bilitary scan if  [place] normal  And lab ua etc  Counseled.   Expectant management and discussion of plan and treatment with opportunity to ask questions and all were answered. The patient agreed with the plan and demonstrated an understanding of the instructions.   Advised to call back or seek an in-person evaluation if worsening  or having  further concerns . Before    Hollie Salk, MD   Last imaging study in record was  2016  FINDINGS: Lower chest:  Lung bases are clear.  Hepatobiliary: Moderate hepatic steatosis. Focal fatty sparing in the left hepatic lobe (series 7/image 15).  12 mm T2 hyperintense lesion in the lateral segment left hepatic dome (series 16/image 10) with associated early arterial enhancement (series 11/ image 27) and washout on delayed Eovist (series 21/ image 28). No morphologic signs of cirrhosis. This appearance suggests a benign hemangioma.  Gallbladder is underdistended. No intrahepatic or extrahepatic ductal dilatation.  Pancreas: Within normal limits.  Spleen: Within normal limits.  Adrenals/Urinary Tract: Adrenal glands are within normal limits.  Mild scarring of the left upper  kidney.  Right kidney is within normal limits.  No hydronephrosis.  Stomach/Bowel: Stomach and visualized bowel are within normal limits.  Vascular/Lymphatic: No evidence of abdominal aortic aneurysm.  No suspicious abdominal lymphadenopathy.  Other: No abdominal ascites.  Musculoskeletal: No focal osseous lesions.  IMPRESSION: Moderate hepatic steatosis with focal fatty sparing in the left hepatic lobe.  12 mm T2 hyperintense lesion in the lateral segment left hepatic dome, with early  arterial enhancement, likely reflecting a flash filling hemangioma.  No suspicious hepatic lesions.   Electronically Signed   By: Julian Hy M.D.   On: 05/21/2014 08:24

## 2018-08-26 ENCOUNTER — Other Ambulatory Visit: Payer: Self-pay

## 2018-08-26 ENCOUNTER — Ambulatory Visit (HOSPITAL_COMMUNITY)
Admission: EM | Admit: 2018-08-26 | Discharge: 2018-08-26 | Disposition: A | Discharge status: Home / Self Care | Payer: BC Managed Care – PPO | Attending: Family Medicine | Admitting: Family Medicine

## 2018-08-26 ENCOUNTER — Encounter (HOSPITAL_COMMUNITY): Payer: Self-pay

## 2018-08-26 ENCOUNTER — Ambulatory Visit (INDEPENDENT_AMBULATORY_CARE_PROVIDER_SITE_OTHER): Payer: BC Managed Care – PPO

## 2018-08-26 DIAGNOSIS — R200 Anesthesia of skin: Secondary | ICD-10-CM | POA: Diagnosis not present

## 2018-08-26 DIAGNOSIS — M542 Cervicalgia: Secondary | ICD-10-CM

## 2018-08-26 DIAGNOSIS — I10 Essential (primary) hypertension: Secondary | ICD-10-CM | POA: Diagnosis not present

## 2018-08-26 DIAGNOSIS — R42 Dizziness and giddiness: Secondary | ICD-10-CM

## 2018-08-26 MED ORDER — MECLIZINE HCL 25 MG PO TABS: 12.5000 mg | ORAL_TABLET | Freq: Three times a day (TID) | ORAL | 0 refills | Status: DC | PRN

## 2018-08-26 MED ORDER — METHYLPREDNISOLONE 4 MG PO TBPK: ORAL_TABLET | ORAL | 0 refills | Status: DC

## 2018-08-26 MED ORDER — TIZANIDINE HCL 4 MG PO TABS: 4.0000 mg | ORAL_TABLET | Freq: Four times a day (QID) | ORAL | 0 refills | Status: DC | PRN

## 2018-08-26 NOTE — ED Provider Notes (Signed)
[facility]    CSN: [id number] Arrival date & time: 08/26/18  1635     History   Chief Complaint Chief Complaint  Patient presents with  . Dizziness  . Neck Pain    HPI Elizabeth Tanner is a 48 y.o. female.   HPI  Patient is here for neck pain and vertigo.  She states that she was laying on her bed, that there was a pillow behind her, she laid back in her head flopped back over the edge of the bed in a whiplash type of movement.  Immediate neck pain.  Immediate vertigo.  She is had vertigo before.  She has been trying vertigo exercises, rest, over-the-counter medicines and she still has symptoms.  She is here in a wheelchair, states that he "does not trust herself" to walk without staggering.  She has not is been walking in her home.  She did not drive here today.  Past Medical History:  Diagnosis Date  . Depression    ? atypical  . Dysmenorrhea   . Fracture of fibula    rt  . Gluten intolerance    self reported  . History of nephrolithiasis   . History of tobacco use   . Hypertension   . Knee injury    fall on snow fracture [place]witt  . Pneumonia 8 12    LLL -clear now  . Wears glasses     Patient Active Problem List   Diagnosis Date Noted  . History of nephrolithiasis   . Visit for preventive health examination 12/13/2013  . Hyperglycemia 12/13/2013  . Amenorrhea 12/13/2013  . Sinusitis 02/06/2013  . Encounter for management and injection of injectable progestin contraceptive 08/16/2011  . LFTs abnormal minor alt 08/16/2011  . Hypertension 03/18/2011  . Stress at work 03/18/2011  . Hair loss 12/26/2010  . Thyroiditis 12/26/2010  . Dyspnea 11/06/2010  . History of tobacco use 11/06/2010  . Other and unspecified hyperlipidemia 07/31/2010  . Preventative health care 07/30/2010  . Elevated blood pressure reading 07/30/2010  . Depo-Provera contraceptive status 07/16/2010  . 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 NONSPECIFIC  ABNORM RESULTS THYROID FUNCT STUDY 11/22/2008  . MYALGIA 11/13/2008  . FATIGUE 11/13/2008  . OBESITY 07/23/2008  . NUMBNESS, ARM 09/12/2007  . NECK PAIN 08/31/2007  . ARM PAIN, RIGHT 08/31/2007  . SLEEPLESSNESS 02/28/2007  . DEPRESSION 11/05/2006  . DYSMENORRHEA 11/05/2006  . IRREGULAR MENSTRUATION 11/05/2006  . ATTENTION DEFICIT DISORDER, HX OF 11/05/2006    Past Surgical History:  Procedure Laterality Date  . BREAST REDUCTION SURGERY  2001  . MOUTH SURGERY    . ORIF TIBIA PLATEAU Right 05/05/2012   Procedure: OPEN REDUCTION INTERNAL FIXATION TIBIAL PLATEAU;  Surgeon: Wylene Simmer, MD;  Location: [facility];  Service: Orthopedics;  Laterality: Right;    OB History   No obstetric history on file.      Home Medications    Prior to Admission medications   Medication Sig Start Date End Date Taking? Authorizing Provider  cholecalciferol (VITAMIN D3) 25 MCG (1000 UT) tablet Take 1,000 Units by mouth daily.   Yes [provider]    Family History Family History  Problem Relation Age of Onset  . Alcohol abuse Other   . Thyroid disease Other   . Arthritis Other        Father has a fused spine uncle with DJD  . Heart attack Maternal Uncle 30       Electrical  problem also maternal aunt  . Melanoma Cousin   . Other Maternal Aunt        68 pacer.   . Cancer Mother   . Diabetes Father   . Osteoarthritis Father   . Hypertension Father     Social History Social History   Tobacco Use  . Smoking status: Former Smoker    Packs/day: 1.50    Years: 8.00    Pack years: 12.00    Types: Cigarettes  . Smokeless tobacco: Never Used  Substance Use Topics  . Alcohol use: Yes    Alcohol/week: 0.0 standard drinks    Comment: occ  . Drug use: No     Allergies   Azithromycin, Amoxicillin, Gluten, Vicodin [hydrocodone-acetaminophen], and Penicillins   Review of Systems Review of Systems  Constitutional: Negative for chills and fever.  HENT: Negative  for ear pain and sore throat.   Eyes: Negative for pain and visual disturbance.  Respiratory: Negative for cough and shortness of breath.   Cardiovascular: Negative for chest pain and palpitations.  Gastrointestinal: Negative for abdominal pain and vomiting.  Genitourinary: Negative for dysuria and hematuria.  Musculoskeletal: Positive for gait problem, neck pain and neck stiffness. Negative for arthralgias and back pain.  Skin: Negative for color change and rash.  Neurological: Positive for dizziness and numbness. Negative for seizures and syncope.  All other systems reviewed and are negative.    Physical Exam Triage Vital Signs ED Triage Vitals  Enc Vitals Group     BP 08/26/18 1652 (!) 178/113     Pulse Rate 08/26/18 1652 (!) 108     Resp 08/26/18 1652 18     Temp 08/26/18 1652 98.4 F (36.9 C)     Temp Source 08/26/18 1652 Oral     SpO2 08/26/18 1652 99 %     Weight --      Height --      Head Circumference --      Peak Flow --      Pain Score 08/26/18 1649 4     Pain Loc --      Pain Edu? --      Excl. in [place]? --    No data found.  Updated Vital Signs BP (!) 178/113 (BP Location: Left Arm)   Pulse (!) 108   Temp 98.4 F (36.9 C) (Oral)   Resp 18   SpO2 99%   Physical Exam Constitutional:      General: She is not in acute distress.    Appearance: She is well-developed.     Comments: Overweight.  Emotional lability  HENT:     Head: Normocephalic and atraumatic.     Right Ear: Tympanic membrane, ear canal and external ear normal.     Left Ear: Tympanic membrane, ear canal and external ear normal.     Nose: Nose normal. No congestion.     Mouth/Throat:     Mouth: Mucous membranes are moist.     Pharynx: No posterior oropharyngeal erythema.  Eyes:     Extraocular Movements: Extraocular movements intact.     Conjunctiva/sclera: Conjunctivae normal.     Pupils: Pupils are equal, round, and reactive to light.     Comments: Nystagmus +  Neck:      Musculoskeletal: Normal range of motion.     Comments: Full but slow ROM Cardiovascular:     Rate and Rhythm: Normal rate and regular rhythm.     Heart sounds: Normal heart sounds.  Pulmonary:  Effort: Pulmonary effort is normal. No respiratory distress.     Breath sounds: Normal breath sounds.  Abdominal:     General: There is no distension.     Palpations: Abdomen is soft.  Musculoskeletal: Normal range of motion.  Skin:    General: Skin is warm and dry.  Neurological:     General: No focal deficit present.     Mental Status: She is alert.     Sensory: No sensory deficit.     Motor: No weakness.     Coordination: Coordination normal.     Deep Tendon Reflexes: Reflexes normal.  Psychiatric:     Comments: anxious      UC Treatments / Results  Labs (all labs ordered are listed, but only abnormal results are displayed) Labs Reviewed - No data to display  EKG None  Radiology No results found.  Procedures Procedures (including critical care time)  Medications Ordered in UC Medications - No data to display  Initial Impression / Assessment and Plan / UC Course  I have reviewed the triage vital signs and the nursing notes.  Pertinent labs & imaging results that were available during my care of the patient were reviewed by me and considered in my medical decision making (see chart for details).     The patient her next x-rays.  The loss of lordosis is likely from muscle spasm.  She has mild degenerative changes.  This would explain the intermittent numbness and tingling in her hands.  I told her I expect her to fully recover.  I prescribed prednisone for the anti-inflammatory, mild muscle relaxer, vertigo to be treated with meclizine and exercise.  Call PCP if not better by Monday Final Clinical Impressions(s) / UC Diagnoses   Final diagnoses:  Neck pain  Vertigo  Numbness in both hands  Uncontrolled hypertension   Discharge Instructions   None    ED  Prescriptions    None     Controlled Substance Prescriptions [place] Controlled Substance Registry consulted? Not Applicable   Raylene Everts, MD 08/26/18 [phone]

## 2018-08-26 NOTE — ED Triage Notes (Signed)
Patient presents to Urgent Care with complaints of dizziness since her head fell off the bed in the middle of the night 4 nights ago. Patient reports she has been having intermittent neck pain and tingling in fingers bilaterally. Pt has been taking 200mg  ibuprofen q6h for the past few days.

## 2018-08-26 NOTE — Discharge Instructions (Addendum)
Activity as tolerated Ice or heat to neck muscles, whichever works better for pain relief Take the prednisone pack as directed.  Take all of day 1 today.  3 now and then 3 at bedtime Take the muscle relaxer as needed Take the meclizine as needed for dizziness Drink lots of fluids Call your doctor if not improving by Monday You may need to go back on your blood pressure medicine
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
## 2018-12-06 ENCOUNTER — Encounter: Payer: Self-pay | Admitting: Internal Medicine

## 2018-12-06 ENCOUNTER — Other Ambulatory Visit: Payer: Self-pay

## 2018-12-06 ENCOUNTER — Telehealth (INDEPENDENT_AMBULATORY_CARE_PROVIDER_SITE_OTHER): Payer: BC Managed Care – PPO | Admitting: Internal Medicine

## 2018-12-06 DIAGNOSIS — M542 Cervicalgia: Secondary | ICD-10-CM

## 2018-12-06 DIAGNOSIS — H81399 Other peripheral vertigo, unspecified ear: Secondary | ICD-10-CM | POA: Diagnosis not present

## 2018-12-19 ENCOUNTER — Ambulatory Visit
Payer: BC Managed Care – PPO | Attending: Internal Medicine | Admitting: Rehabilitative and Restorative Service Providers"

## 2018-12-19 ENCOUNTER — Other Ambulatory Visit: Payer: Self-pay

## 2018-12-19 DIAGNOSIS — R2689 Other abnormalities of gait and mobility: Secondary | ICD-10-CM | POA: Diagnosis present

## 2018-12-19 DIAGNOSIS — R2681 Unsteadiness on feet: Secondary | ICD-10-CM | POA: Insufficient documentation

## 2018-12-19 DIAGNOSIS — R42 Dizziness and giddiness: Secondary | ICD-10-CM | POA: Diagnosis present

## 2018-12-19 NOTE — Patient Instructions (Signed)
Access Code: [email or web address]  URL: [email or web address]  Date: 12/19/2018  Prepared by: Rudell Cobb   Exercises Standing Gaze Stabilization with Head Rotation - 3x daily - 7x weekly Romberg Stance with Eyes Closed - 3 reps - 1 sets - 30 seconds hold - 2x daily - 7x weekly Single Leg Stance - 3 reps - 1 sets - 10-15 seconds hold - 2x daily - 7x weekly

## 2018-12-19 NOTE — Therapy (Signed)
[facility] [address] [place], [place], [address] Phone: [phone]   Fax:  [phone]  Physical Therapy Evaluation  Patient Details  Name: Elizabeth Tanner MRN: [id number] Date of Birth: 15-May-1970 Referring Provider (PT): Shanon Ace, MD   Encounter Date: 12/19/2018  PT End of Session - 12/19/18 1207    Visit Number  1    Number of Visits  6    Date for PT Re-Evaluation  02/02/19    Authorization Type  BCBS    PT Start Time  0845    PT Stop Time  0932    PT Time Calculation (min)  47 min    Activity Tolerance  Patient tolerated treatment well    Behavior During Therapy  [facility] for tasks assessed/performed       Past Medical History:  Diagnosis Date  . Depression    ? atypical  . Dysmenorrhea   . Fracture of fibula    rt  . Gluten intolerance    self reported  . History of nephrolithiasis   . History of tobacco use   . Hypertension   . Knee injury    fall on snow fracture hewitt  . Pneumonia 8 12    LLL -clear now  . Wears glasses     Past Surgical History:  Procedure Laterality Date  . BREAST REDUCTION SURGERY  2001  . MOUTH SURGERY    . ORIF TIBIA PLATEAU Right 05/05/2012   Procedure: OPEN REDUCTION INTERNAL FIXATION TIBIAL PLATEAU;  Surgeon: Wylene Simmer, MD;  Location: [facility];  Service: Orthopedics;  Laterality: Right;    There were no vitals filed for this visit.   Subjective Assessment - 12/19/18 0854    Subjective  The patient had sudden onset of vertigo when rolling in bed in June 2020 reporting her head snapped back rolling off of a pillow.  The episode in June was constant in nature and lasted a week.  She also injured her neck during this episode and had some tingling in her arms and hands.  She was seen by MD for vertigo and neck pain and underwent x-ray.  She was improving in symptoms until she [place] and got an episode of vertigo. This spell  lasted 2 days and was accompanied by  nausea and anxiety.  At this time she states "I can function", but gets symptoms on elevators, busy floor patterns and large curves when driving.  She also gets ear pressure.  She denies tinnitus, does experience aural fullness R side> L.  Her vision blurs when she is fatigued (she has h/o stress related migraines, has not had eye exam in 2 years), denies double vision.  She reports dizziness occurs with bending quickly and is described as "a little lightheaded, a little off."   She is experiencing symptoms 2-3 days/week but notes she is limiting activities.    Pertinent History  some hypertension (not on medicine).  Previous episodic vertigo per subjective report.    Patient Stated Goals  I don't care about my neck.  I want my vertigo to go away.    Currently in Pain?  Yes    Pain Score  1     Pain Location  Neck    Pain Descriptors / Indicators  Sore         [facility] PT Assessment - 12/19/18 0903      Assessment   Medical Diagnosis  Vertigo  Referring Provider (PT)  Shanon Ace, MD    Onset Date/Surgical Date  --   08/2018   Prior Therapy  saw a chiropractor, but did not pursue treatment for neck      Precautions   Precautions  None      Restrictions   Weight Bearing Restrictions  No      Balance Screen   Has the patient fallen in the past 6 months  No    Has the patient had a decrease in activity level because of a fear of falling?   --   due to dizziness and less secure in balance   Is the patient reluctant to leave their home because of a fear of falling?   No      Home Environment   Living Environment  Private residence    Type of [place] Access  Stairs to enter    Entrance Stairs-Number of Steps  2    Home Layout  One level    Brandon seat      Prior Function   Level of Independence  Independent      High Level Balance   High Level Balance Comments  Compliant surface standing with eyes open x 30  seconds; eyes closed x 5 seconds.  She has 2 second single leg stance bilaterally.           Vestibular Assessment - 12/19/18 0905      Vestibular Assessment   General Observation  Wears progressive lenses (trifocal) when awake      Symptom Behavior   Subjective history of current problem  h/o episodic vertigo in the past; has not remained as it currently is    Type of Dizziness   Lightheadedness;Imbalance   ear pressure, nausea   Frequency of Dizziness  2-3 times/week    Duration of Dizziness  varies; pressure in ears resolves quickly; can last for up to an hour    Symptom Nature  Motion provoked    Aggravating Factors  Activity in general;Turning head quickly    Relieving Factors  Head stationary;Lying supine    Progression of Symptoms  Better    History of similar episodes  Does note noise sensitivity during episodes.      Oculomotor Exam   Oculomotor Alignment  Normal    Ocular ROM  WNLs    Spontaneous  Absent    Gaze-induced   Absent    Smooth Pursuits  Intact    Saccades  Intact      Vestibulo-Ocular Reflex   VOR 1 Head Only (x 1 viewing)  Slow, self selected pace provokes a .5/10 noting abnormal sensation in her head.    Comment  Head impulse test= negative bilaterally       Visual Acuity   Static  line 7    Dynamic  line 2   >2 line difference indicates abnormal VOR/ provokes 3/10     Positional Testing   Dix-Hallpike  Dix-Hallpike Right;Dix-Hallpike Left    Sidelying Test  Sidelying Right;Sidelying Left    Horizontal Canal Testing  Horizontal Canal Right;Horizontal Canal Left      Dix-Hallpike Right   Dix-Hallpike Right Duration  0    Dix-Hallpike Right Symptoms  No nystagmus      Dix-Hallpike Left   Dix-Hallpike Left Duration  0    Dix-Hallpike Left Symptoms  No nystagmus      Sidelying Right   Sidelying Right Duration  no  nystagmus or dizziness; minimal queasiness    Sidelying Right Symptoms  No nystagmus      Sidelying Left   Sidelying Left  Duration  0    Sidelying Left Symptoms  No nystagmus      Horizontal Canal Right   Horizontal Canal Right Duration  0    Horizontal Canal Right Symptoms  Normal      Horizontal Canal Left   Horizontal Canal Left Duration  0    Horizontal Canal Left Symptoms  Normal          Objective measurements completed on examination: See above findings.      [facility] Adult PT Treatment/Exercise - 12/19/18 1212      Neuro Re-ed    Neuro Re-ed Details   single leg stance and feet together on solid surface with eyes closed.      Vestibular Treatment/Exercise - 12/19/18 0925      Vestibular Treatment/Exercise   Vestibular Treatment Provided  Gaze    Gaze Exercises  X1 Viewing Horizontal      X1 Viewing Horizontal   Foot Position  feet apart standing    Comments  recommended 30 seconds of head motion with eyes on target attempting to keep visual fixation without blurring            PT Education - 12/19/18 1207    Education Details  established initial HEP       PT Short Term Goals - 12/19/18 1208      PT SHORT TERM GOAL #1   Title  The patient will be indep with HEP for gaze adaptation, habituation, and high level balance.    Time  4    Period  Weeks    Target Date  01/18/19      PT SHORT TERM GOAL #2   Title  The patient will tolerate gaze x 1 viewing x 30 seconds without c/o visual blurring.    Time  4    Period  Weeks    Target Date  01/18/19      PT SHORT TERM GOAL #3   Title  The patient will tolerate rolling in bed without c/o vertigo or nausea.    Time  4    Period  Weeks    Target Date  01/18/19        PT Long Term Goals - 12/19/18 1211      PT LONG TERM GOAL #1   Title  The patient will be indep with progression of HEP.    Time  6    Period  Weeks    Target Date  02/02/19      PT LONG TERM GOAL #2   Title  The patient  will improve static versus dynamic visual acuity from 5 line difference to < or equal to 3 line difference to demo improving VOR.     Time  6    Period  Weeks    Target Date  02/02/19      PT LONG TERM GOAL #3   Title  The patient will maintain single leg stance x 10 seconds bilaterally to demo improving balance during narrow base of support.    Time  6    Period  Weeks    Target Date  02/02/19      PT LONG TERM GOAL #4   Title  The patient will maintain eyes closed 30 seconds on foam with feet apart to demo improved vestibular system use for balance.  Baseline  5 seconds of eyes closed with sway.    Time  6    Period  Weeks    Target Date  02/02/19             Plan - 12/19/18 1214    Clinical Impression Statement  The patient is a 48 yo female presenting to OP physical therapy with sudden onset of dizziness with an abrupt roll in bed in June 2020.  She has had some neck pain since that time, but her main goal for attending therapy is to address her vertigo.  She has 5 line difference on static versus dynamic visual acuity and gets visual blurring with gaze x 1 viewing.  She had not had lenses checked in 2+years and PT recommended she schedule an eye exam.  She was negative today for nystagmus with positional testing but does note very mild lightheaded, nausea sensation.  She had dec'd high level balance on foam with eyes closed and during single leg stance activities.  PT to address these deficits.    Examination-Participation Restrictions  Community Activity;Driving;Cleaning    Stability/Clinical Decision Making  Stable/Uncomplicated    Clinical Decision Making  Low    Rehab Potential  Good    PT Frequency  1x / week    PT Duration  6 weeks    PT Treatment/Interventions  ADLs/Self Care Home Management;Neuromuscular re-education;Patient/family education;Canalith Repostioning;Vestibular;Manual techniques;Balance training;Therapeutic exercise;Therapeutic activities;Functional mobility training;Gait training    PT Next Visit Plan  Check initial HEP, progress eyes closed exercise to on foam, review VOR.  Add  some further habituation activities increasing head motion and bending tasks to tolerance.    Consulted and Agree with Plan of Care  Patient       Patient will benefit from skilled therapeutic intervention in order to improve the following deficits and impairments:  Dizziness, Decreased balance  Visit Diagnosis: Dizziness and giddiness  Other abnormalities of gait and mobility  Unsteadiness on feet     Problem List Patient Active Problem List   Diagnosis Date Noted  . History of nephrolithiasis   . Visit for preventive health examination 12/13/2013  . Hyperglycemia 12/13/2013  . Amenorrhea 12/13/2013  . Sinusitis 02/06/2013  . Encounter for management and injection of injectable progestin contraceptive 08/16/2011  . LFTs abnormal minor alt 08/16/2011  . Hypertension 03/18/2011  . Stress at work 03/18/2011  . Hair loss 12/26/2010  . Thyroiditis 12/26/2010  . Dyspnea 11/06/2010  . History of tobacco use 11/06/2010  . Other and unspecified hyperlipidemia 07/31/2010  . Preventative health care 07/30/2010  . Elevated blood pressure reading 07/30/2010  . Depo-Provera contraceptive status 07/16/2010  . 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 NONSPECIFIC ABNORM RESULTS THYROID FUNCT STUDY 11/22/2008  . MYALGIA 11/13/2008  . FATIGUE 11/13/2008  . OBESITY 07/23/2008  . NUMBNESS, ARM 09/12/2007  . NECK PAIN 08/31/2007  . ARM PAIN, RIGHT 08/31/2007  . SLEEPLESSNESS 02/28/2007  . DEPRESSION 11/05/2006  . DYSMENORRHEA 11/05/2006  . IRREGULAR MENSTRUATION 11/05/2006  . ATTENTION DEFICIT DISORDER, HX OF 11/05/2006    Malakye Nolden, PT 12/19/2018, 12:19 PM  [facility] [address] [place], [place], [address] Phone: [phone]   Fax:  [phone]  Name: Elizabeth Tanner MRN: [id number] Date of Birth: 15-Aug-1970

## 2018-12-29 ENCOUNTER — Ambulatory Visit: Payer: BC Managed Care – PPO | Admitting: Physical Therapy

## 2018-12-29 ENCOUNTER — Other Ambulatory Visit: Payer: Self-pay

## 2018-12-29 ENCOUNTER — Encounter: Payer: Self-pay | Admitting: Physical Therapy

## 2018-12-29 DIAGNOSIS — R2689 Other abnormalities of gait and mobility: Secondary | ICD-10-CM

## 2018-12-29 DIAGNOSIS — R42 Dizziness and giddiness: Secondary | ICD-10-CM | POA: Diagnosis not present

## 2018-12-29 DIAGNOSIS — R2681 Unsteadiness on feet: Secondary | ICD-10-CM

## 2018-12-29 NOTE — Patient Instructions (Signed)
Access Code: [email or web address]  URL: [email or web address]  Date: 12/29/2018  Prepared by: Misty Stanley   Exercises Standing Gaze Stabilization with Head Rotation - 60 second hold - 3x daily - 7x weekly Single Leg Stance - 3 reps - 1 sets - 10-15 seconds hold - 2x daily - 7x weekly Standing with Eyes Closed - 10 reps - 2 sets - 2x daily - 7x weekly Romberg Stance on Foam Pad - 3 sets - 30 second hold - 2x daily - 7x weekly March in Place - 10 reps - 2 sets - 2x daily - 7x weekly

## 2018-12-29 NOTE — Therapy (Signed)
[facility] [address] [place], [place], [address] Phone: [phone]   Fax:  [phone]  Physical Therapy Treatment  Patient Details  Name: Elizabeth Tanner MRN: [id number] Date of Birth: September 25, 1970 Referring Provider (PT): Shanon Ace, MD   Encounter Date: 12/29/2018  PT End of Session - 12/29/18 1207    Visit Number  2    Number of Visits  6    Date for PT Re-Evaluation  02/02/19    Authorization Type  BCBS    PT Start Time  0804    PT Stop Time  0843    PT Time Calculation (min)  39 min    Activity Tolerance  Patient tolerated treatment well    Behavior During Therapy  [facility] for tasks assessed/performed       Past Medical History:  Diagnosis Date  . Depression    ? atypical  . Dysmenorrhea   . Fracture of fibula    rt  . Gluten intolerance    self reported  . History of nephrolithiasis   . History of tobacco use   . Hypertension   . Knee injury    fall on snow fracture [place]witt  . Pneumonia 8 12    LLL -clear now  . Wears glasses     Past Surgical History:  Procedure Laterality Date  . BREAST REDUCTION SURGERY  2001  . MOUTH SURGERY    . ORIF TIBIA PLATEAU Right 05/05/2012   Procedure: OPEN REDUCTION INTERNAL FIXATION TIBIAL PLATEAU;  Surgeon: Wylene Simmer, MD;  Location: [facility];  Service: Orthopedics;  Laterality: Right;    There were no vitals filed for this visit.  Subjective Assessment - 12/29/18 0807    Subjective  Pt reports that she drove herself to therapy today, less scary.  Exercise is going well and feels like it is helping; gets mild symptoms but improves.  Still most sensitive to looking up and then back down.  Neck is better.    Pertinent History  some hypertension (not on medicine).  Previous episodic vertigo per subjective report.    Patient Stated Goals  I don't care about my neck.  I want my vertigo to go away.    Currently in Pain?  No/denies                         Vestibular Treatment/Exercise - 12/29/18 0809      Vestibular Treatment/Exercise   Vestibular Treatment Provided  Gaze    Gaze Exercises  X1 Viewing Horizontal   hold on vertical due to progressive lenses     X1 Viewing Horizontal   Foot Position  feet apart    Reps  2    Comments  30 seconds > 60 seconds; cues to decrease rotation ROM but maintain speed; mild symptoms at 60 seconds.  Less blurring of letter.      Progressed HEP as below - pt return demonstrated:   Access Code: [email or web address]  URL: [email or web address]  Date: 12/29/2018  Prepared by: Misty Stanley   Exercises Standing Gaze Stabilization with Head Rotation - 60 second hold - 3x daily - 7x weekly Single Leg Stance - 3 reps - 1 sets - 10-15 seconds hold - 2x daily - 7x weekly - add in slow head movements Standing with Eyes Closed - 10 reps - 2 sets - 2x daily - 7x weekly - add in head turns and nods x 10 reps each Romberg  Stance on Foam Pad - 3 sets - 30 second hold - 2x daily - 7x weekly - just hold for 30 seconds, no head turns currently March in Place - 10 reps - 2 sets - 2x daily - 7x weekly - in corner with head turns and nods x 8 each direction      PT Education - 12/29/18 1207    Education Details  progressed HEP    Person(s) Educated  Patient    Methods  Explanation;Demonstration;Handout    Comprehension  Verbalized understanding;Returned demonstration       PT Short Term Goals - 12/19/18 1208      PT SHORT TERM GOAL #1   Title  The patient will be indep with HEP for gaze adaptation, habituation, and high level balance.    Time  4    Period  Weeks    Target Date  01/18/19      PT SHORT TERM GOAL #2   Title  The patient will tolerate gaze x 1 viewing x 30 seconds without c/o visual blurring.    Time  4    Period  Weeks    Target Date  01/18/19      PT SHORT TERM GOAL #3   Title  The patient will tolerate rolling in bed without c/o vertigo or  nausea.    Time  4    Period  Weeks    Target Date  01/18/19        PT Long Term Goals - 12/19/18 1211      PT LONG TERM GOAL #1   Title  The patient will be indep with progression of HEP.    Time  6    Period  Weeks    Target Date  02/02/19      PT LONG TERM GOAL #2   Title  The patient  will improve static versus dynamic visual acuity from 5 line difference to < or equal to 3 line difference to demo improving VOR.    Time  6    Period  Weeks    Target Date  02/02/19      PT LONG TERM GOAL #3   Title  The patient will maintain single leg stance x 10 seconds bilaterally to demo improving balance during narrow base of support.    Time  6    Period  Weeks    Target Date  02/02/19      PT LONG TERM GOAL #4   Title  The patient will maintain eyes closed 30 seconds on foam with feet apart to demo improved vestibular system use for balance.    Baseline  5 seconds of eyes closed with sway.    Time  6    Period  Weeks    Target Date  02/02/19            Plan - 12/29/18 1207    Clinical Impression Statement  Pt is showing progress and adaptation of vestibular system since eval.  Able to progress x1 viewing and corner balance exercises.  Also began to incorporate more dynamic movement with head turns.  Pt only reported mild symptoms with exercises.  Will continue to progress towards LTG.    Examination-Participation Restrictions  Community Activity;Driving;Cleaning    Stability/Clinical Decision Making  Stable/Uncomplicated    Rehab Potential  Good    PT Frequency  1x / week    PT Duration  6 weeks    PT Treatment/Interventions  ADLs/Self Care Home  Management;Neuromuscular re-education;Patient/family education;Canalith Repostioning;Vestibular;Manual techniques;Balance training;Therapeutic exercise;Therapeutic activities;Functional mobility training;Gait training    PT Next Visit Plan  Continue to progress HEP: x1 viewing, corner balance, walking with head turns and bending  tasks to tolerance.    Consulted and Agree with Plan of Care  Patient       Patient will benefit from skilled therapeutic intervention in order to improve the following deficits and impairments:  Dizziness, Decreased balance  Visit Diagnosis: Dizziness and giddiness  Other abnormalities of gait and mobility  Unsteadiness on feet     Problem List Patient Active Problem List   Diagnosis Date Noted  . History of nephrolithiasis   . Visit for preventive health examination 12/13/2013  . Hyperglycemia 12/13/2013  . Amenorrhea 12/13/2013  . Sinusitis 02/06/2013  . Encounter for management and injection of injectable progestin contraceptive 08/16/2011  . LFTs abnormal minor alt 08/16/2011  . Hypertension 03/18/2011  . Stress at work 03/18/2011  . Hair loss 12/26/2010  . Thyroiditis 12/26/2010  . Dyspnea 11/06/2010  . History of tobacco use 11/06/2010  . Other and unspecified hyperlipidemia 07/31/2010  . Preventative health care 07/30/2010  . Elevated blood pressure reading 07/30/2010  . Depo-Provera contraceptive status 07/16/2010  . 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 NONSPECIFIC ABNORM RESULTS THYROID FUNCT STUDY 11/22/2008  . MYALGIA 11/13/2008  . FATIGUE 11/13/2008  . OBESITY 07/23/2008  . NUMBNESS, ARM 09/12/2007  . NECK PAIN 08/31/2007  . ARM PAIN, RIGHT 08/31/2007  . SLEEPLESSNESS 02/28/2007  . DEPRESSION 11/05/2006  . DYSMENORRHEA 11/05/2006  . IRREGULAR MENSTRUATION 11/05/2006  . ATTENTION DEFICIT DISORDER, HX OF 11/05/2006    Rico Junker, PT, DPT 12/29/18    12:12 PM    [facility] [address] [place], [place], [address] Phone: [phone]   Fax:  [phone]  Name: Elizabeth Tanner MRN: [id number] Date of Birth: 1971-02-24

## 2019-01-05 ENCOUNTER — Other Ambulatory Visit: Payer: Self-pay

## 2019-01-05 ENCOUNTER — Encounter: Payer: Self-pay | Admitting: Physical Therapy

## 2019-01-05 ENCOUNTER — Ambulatory Visit: Payer: BC Managed Care – PPO | Admitting: Physical Therapy

## 2019-01-05 DIAGNOSIS — R42 Dizziness and giddiness: Secondary | ICD-10-CM | POA: Diagnosis not present

## 2019-01-05 DIAGNOSIS — R2681 Unsteadiness on feet: Secondary | ICD-10-CM

## 2019-01-05 NOTE — Patient Instructions (Signed)
Decrease time for letter exercise to 1" - do standing on pillows or compliant surface; keep plain background  Practice walking with head turns in the home  Marching on pillow - Eyes open/closed - no head turns initially; progress to marching with head turns - eyes open, then eyes closed for more challenge

## 2019-01-06 NOTE — Therapy (Signed)
[facility] [address] [place], [place], [address] Phone: [phone]   Fax:  [phone]  Physical Therapy Treatment  Patient Details  Name: Elizabeth Tanner MRN: [id number] Date of Birth: [birth year] Referring Provider (PT): Shanon Ace, MD   Encounter Date: 01/05/2019  PT End of Session - 01/06/19 2022    Visit Number  3    Number of Visits  6    Date for PT Re-Evaluation  02/02/19    Authorization Type  BCBS    PT Start Time  0805    PT Stop Time  0847    PT Time Calculation (min)  42 min    Activity Tolerance  Patient tolerated treatment well    Behavior During Therapy  [facility] for tasks assessed/performed       Past Medical History:  Diagnosis Date  . Depression    ? atypical  . Dysmenorrhea   . Fracture of fibula    rt  . Gluten intolerance    self reported  . History of nephrolithiasis   . History of tobacco use   . Hypertension   . Knee injury    fall on snow fracture [place]  . Pneumonia 8 12    LLL -clear now  . Wears glasses     Past Surgical History:  Procedure Laterality Date  . BREAST REDUCTION SURGERY  2001  . MOUTH SURGERY    . ORIF TIBIA PLATEAU Right 05/05/2012   Procedure: OPEN REDUCTION INTERNAL FIXATION TIBIAL PLATEAU;  Surgeon: Wylene Simmer, MD;  Location: [facility];  Service: Orthopedics;  Laterality: Right;    There were no vitals filed for this visit.          Vestibular Assessment - 01/06/19 0001      Visual Acuity   Static  line 7    Dynamic  line 5   c/o dizziness upon completion of test     Sidelying Right   Sidelying Right Duration  none    Sidelying Right Symptoms  No nystagmus      Sidelying Left   Sidelying Left Duration  none    Sidelying Left Symptoms  No nystagmus                    Balance Exercises - 01/06/19 2019      Balance Exercises: Standing   Standing Eyes Opened  Narrow base of support (BOS);Wide  (BOA);Head turns;Foam/compliant surface;5 reps    Standing Eyes Closed  Narrow base of support (BOS);Wide (BOA);Head turns;5 reps;Foam/compliant surface    Other Standing Exercises  pt performed amb. with head turns - both horizontal and vertical approx. 40'; amb. making circles with ball - counterclockwise, clockwise , "v" and "+" patterns         PT Education - 01/06/19 2021    Education Details  progressed HEP - amb. with head turns, marching on pillow, and x1 viewing on pillow (time decreased to 1" only)    Person(s) Educated  Patient    Methods  Explanation;Demonstration    Comprehension  Verbalized understanding;Returned demonstration       PT Short Term Goals - 01/06/19 2025      PT SHORT TERM GOAL #1   Title  The patient will be indep with HEP for gaze adaptation, habituation, and high level balance.    Time  4    Period  Weeks    Target Date  01/18/19  PT SHORT TERM GOAL #2   Title  The patient will tolerate gaze x 1 viewing x 30 seconds without c/o visual blurring.    Time  4    Period  Weeks    Target Date  01/18/19      PT SHORT TERM GOAL #3   Title  The patient will tolerate rolling in bed without c/o vertigo or nausea.    Time  4    Period  Weeks    Target Date  01/18/19        PT Long Term Goals - 01/06/19 2025      PT LONG TERM GOAL #1   Title  The patient will be indep with progression of HEP.    Time  6    Period  Weeks      PT LONG TERM GOAL #2   Title  The patient  will improve static versus dynamic visual acuity from 5 line difference to < or equal to 3 line difference to demo improving VOR.    Time  6    Period  Weeks      PT LONG TERM GOAL #3   Title  The patient will maintain single leg stance x 10 seconds bilaterally to demo improving balance during narrow base of support.    Time  6    Period  Weeks      PT LONG TERM GOAL #4   Title  The patient will maintain eyes closed 30 seconds on foam with feet apart to demo improved  vestibular system use for balance.    Baseline  5 seconds of eyes closed with sway.    Time  6    Period  Weeks              Patient will benefit from skilled therapeutic intervention in order to improve the following deficits and impairments:     Visit Diagnosis: Dizziness and giddiness  Unsteadiness on feet     Problem List Patient Active Problem List   Diagnosis Date Noted  . History of nephrolithiasis   . Visit for preventive health examination 12/13/2013  . Hyperglycemia 12/13/2013  . Amenorrhea 12/13/2013  . Sinusitis 02/06/2013  . Encounter for management and injection of injectable progestin contraceptive 08/16/2011  . LFTs abnormal minor alt 08/16/2011  . Hypertension 03/18/2011  . Stress at work 03/18/2011  . Hair loss 12/26/2010  . Thyroiditis 12/26/2010  . Dyspnea 11/06/2010  . History of tobacco use 11/06/2010  . Other and unspecified hyperlipidemia 07/31/2010  . Preventative health care 07/30/2010  . Elevated blood pressure reading 07/30/2010  . Depo-Provera contraceptive status 07/16/2010  . 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 NONSPECIFIC ABNORM RESULTS THYROID FUNCT STUDY 11/22/2008  . MYALGIA 11/13/2008  . FATIGUE 11/13/2008  . OBESITY 07/23/2008  . NUMBNESS, ARM 09/12/2007  . NECK PAIN 08/31/2007  . ARM PAIN, RIGHT 08/31/2007  . SLEEPLESSNESS 02/28/2007  . DEPRESSION 11/05/2006  . DYSMENORRHEA 11/05/2006  . IRREGULAR MENSTRUATION 11/05/2006  . ATTENTION DEFICIT DISORDER, HX OF 11/05/2006    Alda Lea, PT 01/06/2019, 8:26 PM  [facility] [address] [place], [place], [address] Phone: [phone]   Fax:  [phone]  Name: Elizabeth Tanner MRN: [id number] Date of Birth: 11/22/1970

## 2019-01-10 ENCOUNTER — Encounter: Payer: Self-pay | Admitting: Physical Therapy

## 2019-01-10 ENCOUNTER — Ambulatory Visit: Payer: BC Managed Care – PPO | Admitting: Physical Therapy

## 2019-01-10 ENCOUNTER — Other Ambulatory Visit: Payer: Self-pay

## 2019-01-10 DIAGNOSIS — R2681 Unsteadiness on feet: Secondary | ICD-10-CM

## 2019-01-10 DIAGNOSIS — R42 Dizziness and giddiness: Secondary | ICD-10-CM | POA: Diagnosis not present

## 2019-01-11 NOTE — Therapy (Signed)
[facility] [address] [place], [place], [address] Phone: [phone]   Fax:  [phone]  Physical Therapy Treatment  Patient Details  Name: Elizabeth Tanner MRN: [id number] Date of Birth: 1970/11/30 Referring Provider (PT): Shanon Ace, MD   Encounter Date: 01/10/2019  PT End of Session - 01/11/19 0913    Visit Number  4    Number of Visits  6    Date for PT Re-Evaluation  02/02/19    Authorization Type  BCBS    PT Start Time  1020    PT Stop Time  1100    PT Time Calculation (min)  40 min    Activity Tolerance  Patient tolerated treatment well    Behavior During Therapy  [facility] for tasks assessed/performed       Past Medical History:  Diagnosis Date  . Depression    ? atypical  . Dysmenorrhea   . Fracture of fibula    rt  . Gluten intolerance    self reported  . History of nephrolithiasis   . History of tobacco use   . Hypertension   . Knee injury    fall on snow fracture [place]  . Pneumonia 8 12    LLL -clear now  . Wears glasses     Past Surgical History:  Procedure Laterality Date  . BREAST REDUCTION SURGERY  2001  . MOUTH SURGERY    . ORIF TIBIA PLATEAU Right 05/05/2012   Procedure: OPEN REDUCTION INTERNAL FIXATION TIBIAL PLATEAU;  Surgeon: Wylene Simmer, MD;  Location: [facility];  Service: Orthopedics;  Laterality: Right;    There were no vitals filed for this visit.  Subjective Assessment - 01/10/19 1020    Subjective  Pt states she has been doing exercises regularly at home - reports she is to get her new glasses this week    Pertinent History  some hypertension (not on medicine).  Previous episodic vertigo per subjective report.    Patient Stated Goals  I don't care about my neck.  I want my vertigo to go away.    Currently in Pain?  No/denies            NeuroRe-ed:  Marketing executive Test - composite score 61/100 with N= 70/100  Condition 1 - trials 1 & 2  WNL's with trial 3 slightly decreased Condition 2 - all 3 trials WNL's Condition 3 - all 3 trials WNL's Condition 4- trials 1 & 3 WNL's with trial 2 slightly decr. Condition 5 - FALL on all 3 trials Condition 6 - all 3 trials WNL's  Somatosensory and visual inputs WNL's Vestibular input score 2/100 with N= 55/100   Pt performed gentle bouncing on blue physioball with EO/EC; added head turns with EO/EC with the bouncing to facilitate Incr. Vestibular input      Self Care - reviewed and discussed STG's and progress; reviewed HEP and progressions for exercises          Balance Exercises - 01/11/19 0909      Balance Exercises: Standing   Standing Eyes Opened  Narrow base of support (BOS);Wide (BOA);Head turns;Foam/compliant surface;5 reps    Standing Eyes Closed  Wide (BOA);Head turns;Foam/compliant surface;5 reps    Other Standing Exercises  amb. with abrupt stop and turn 180 degrees - 4 reps for habituation        PT Education - 01/11/19 0912    Education Details  discussed STG's and progress; reviewed HEP  Person(s) Educated  Patient    Methods  Explanation    Comprehension  Verbalized understanding       PT Short Term Goals - 01/10/19 1022      PT SHORT TERM GOAL #1   Title  The patient will be indep with HEP for gaze adaptation, habituation, and high level balance.    Time  4    Period  Weeks    Status  Achieved    Target Date  01/18/19      PT SHORT TERM GOAL #2   Title  The patient will tolerate gaze x 1 viewing x 30 seconds without c/o visual blurring.    Baseline  defer this goal as pt is getting new eye glasses this week - 01-10-19    Time  4    Period  Weeks    Status  Deferred    Target Date  01/18/19      PT SHORT TERM GOAL #3   Title  The patient will tolerate rolling in bed without c/o vertigo or nausea.    Baseline  inconsistent in provoking symptoms - 01-10-19    Time  4    Period  Weeks    Status  Partially Met    Target Date   01/18/19        PT Long Term Goals - 01/06/19 2025      PT LONG TERM GOAL #1   Title  The patient will be indep with progression of HEP.    Time  6    Period  Weeks      PT LONG TERM GOAL #2   Title  The patient  will improve static versus dynamic visual acuity from 5 line difference to < or equal to 3 line difference to demo improving VOR.    Time  6    Period  Weeks      PT LONG TERM GOAL #3   Title  The patient will maintain single leg stance x 10 seconds bilaterally to demo improving balance during narrow base of support.    Time  6    Period  Weeks      PT LONG TERM GOAL #4   Title  The patient will maintain eyes closed 30 seconds on foam with feet apart to demo improved vestibular system use for balance.    Baseline  5 seconds of eyes closed with sway.    Time  6    Period  Weeks            Plan - 01/11/19 0915    Clinical Impression Statement  Pt has minimal use of vestibular input per SOT - however, score is perplexing as pt scored WNL's on all 3 trials of condition 6 and unable to maintain balance on any of 3 trials of condition 5:  composite score 61/100 with N=70/100.  Pt has met STG #1, STG #2 deferred until pt gets new glasses this week and STG #3 partially met as vertigo continues to occur intermittently with bed mobility and certain movements.    PT Frequency  1x / week    PT Duration  6 weeks    PT Treatment/Interventions  ADLs/Self Care Home Management;Neuromuscular re-education;Patient/family education;Canalith Repostioning;Vestibular;Manual techniques;Balance training;Therapeutic exercise;Therapeutic activities;Functional mobility training;Gait training    PT Next Visit Plan  check upgraded HEP - cont vestibular exs.    PT Home Exercise Plan  amb. with head turns, marching on pillow, tracking ball during ambulation  Consulted and Agree with Plan of Care  Other (Comment)       Patient will benefit from skilled therapeutic intervention in order to  improve the following deficits and impairments:     Visit Diagnosis: Dizziness and giddiness  Unsteadiness on feet     Problem List Patient Active Problem List   Diagnosis Date Noted  . History of nephrolithiasis   . Visit for preventive health examination 12/13/2013  . Hyperglycemia 12/13/2013  . Amenorrhea 12/13/2013  . Sinusitis 02/06/2013  . Encounter for management and injection of injectable progestin contraceptive 08/16/2011  . LFTs abnormal minor alt 08/16/2011  . Hypertension 03/18/2011  . Stress at work 03/18/2011  . Hair loss 12/26/2010  . Thyroiditis 12/26/2010  . Dyspnea 11/06/2010  . History of tobacco use 11/06/2010  . Other and unspecified hyperlipidemia 07/31/2010  . Preventative health care 07/30/2010  . Elevated blood pressure reading 07/30/2010  . Depo-Provera contraceptive status 07/16/2010  . 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 NONSPECIFIC ABNORM RESULTS THYROID FUNCT STUDY 11/22/2008  . MYALGIA 11/13/2008  . FATIGUE 11/13/2008  . OBESITY 07/23/2008  . NUMBNESS, ARM 09/12/2007  . NECK PAIN 08/31/2007  . ARM PAIN, RIGHT 08/31/2007  . SLEEPLESSNESS 02/28/2007  . DEPRESSION 11/05/2006  . DYSMENORRHEA 11/05/2006  . IRREGULAR MENSTRUATION 11/05/2006  . ATTENTION DEFICIT DISORDER, HX OF 11/05/2006    Alda Lea, PT 01/11/2019, 9:19 AM  [facility] [address] [place], [place], [address] Phone: [phone]   Fax:  [phone]  Name: Elizabeth Tanner MRN: [id number] Date of Birth: 04-Nov-1970

## 2019-01-12 ENCOUNTER — Encounter: Payer: BC Managed Care – PPO | Admitting: Physical Therapy

## 2019-01-18 NOTE — Progress Notes (Signed)
Virtual Visit via Video Note  I connected with@ on 11 07/2018 at  8:30 AM EST by a video enabled telemedicine application and verified that I am speaking with the correct person using two identifiers. Location patient: home Location provider: home office Persons participating in the virtual visit: patient, provider  WIth national recommendations  regarding COVID 19 pandemic   video visit is advised over in office visit for this patient.  Patient aware  of the limitations of evaluation and management by telemedicine and  availability of in person appointments. and agreed to proceed.   HPI: Elizabeth Tanner presents for video visit  For  Tobacco cessation counseling   tobacco use  Hx : First  Age 60 - 71  "a lot" stoped cause got sick and sinus sx and stress at the time   Restarted  In 2013  Stress at work  Then increase last dec when mom died . Back down to  4-6 cig per day [employer]   No vaping. Splits 1/2 at a time  Working from home  Now ok  Usually first at 1 pm recently in am some  Nicotine gum  Seems to burn mouth   So not using now  Advised to quit and impact of smoking willingness to attempt to quit  About 50 %   ROS: See pertinent positives and negatives per HPI. No cp sob  Vertigo is gettting better under rehab.  But feels limiting for physical activity   Past Medical History:  Diagnosis Date  . Depression    ? atypical  . Dysmenorrhea   . Fracture of fibula    rt  . Gluten intolerance    self reported  . History of nephrolithiasis   . History of tobacco use   . Hypertension   . Knee injury    fall on snow fracture [place]  . Pneumonia 8 12    LLL -clear now  . Wears glasses     Past Surgical History:  Procedure Laterality Date  . BREAST REDUCTION SURGERY  2001  . MOUTH SURGERY    . ORIF TIBIA PLATEAU Right 05/05/2012   Procedure: OPEN REDUCTION INTERNAL FIXATION TIBIAL PLATEAU;  Surgeon: Wylene Simmer, MD;  Location: [facility];  Service:  Orthopedics;  Laterality: Right;    Family History  Problem Relation Age of Onset  . Alcohol abuse Other   . Thyroid disease Other   . Arthritis Other        Father has a fused spine uncle with DJD  . Heart attack Maternal Uncle 30       Electrical problem also maternal aunt  . Melanoma Cousin   . Other Maternal Aunt        68 pacer.   . Cancer Mother   . Diabetes Father   . Osteoarthritis Father   . Hypertension Father     Social History   Tobacco Use  . Smoking status: Former Smoker    Packs/day: 1.50    Years: 8.00    Pack years: 12.00    Types: Cigarettes  . Smokeless tobacco: Never Used  Substance Use Topics  . Alcohol use: Yes    Alcohol/week: 0.0 standard drinks    Comment: occ  . Drug use: No      Current Outpatient Medications:  .  buPROPion (WELLBUTRIN XL) 150 MG 24 hr tablet, Take 1 by mouth daily , increase to 2 by mouth  in am after 1 week, Disp: 60  tablet, Rfl: 1 .  cholecalciferol (VITAMIN D3) 25 MCG (1000 UT) tablet, Take 1,000 Units by mouth daily., Disp: , Rfl:  .  meclizine (ANTIVERT) 25 MG tablet, Take 0.5-1 tablets (12.5-25 mg total) by mouth 3 (three) times daily as needed for dizziness. (Patient not taking: Reported on 12/29/2018), Disp: 30 tablet, Rfl: 0 .  methylPREDNISolone (MEDROL DOSEPAK) 4 MG TBPK tablet, tad (Patient not taking: Reported on 12/19/2018), Disp: 21 tablet, Rfl: 0 .  tiZANidine (ZANAFLEX) 4 MG tablet, Take 1-2 tablets (4-8 mg total) by mouth every 6 (six) hours as needed for muscle spasms. (Patient not taking: Reported on 12/19/2018), Disp: 21 tablet, Rfl: 0  EXAM: BP Readings from Last 3 Encounters:  08/26/18 (!) 154/96  02/06/18 (!) 171/123  11/29/17 (!) 155/106    VITALS per patient if applicable:  GENERAL: alert, oriented, appears well and in no acute distress  HEENT: atraumatic, conjunttiva clear, no obvious abnormalities on inspection of external nose and ears  NECK: normal movements of the head and  neck  LUNGS: on inspection no signs of respiratory distress, breathing rate appears normal, no obvious gross SOB, gasping or wheezing  CV: no obvious cyanosis  MS: moves all visible extremities without noticeable abnormality  PSYCH/NEURO: pleasant and cooperative, no obvious depression or anxiety, speech and thought processing grossly intact Lab Results  Component Value Date   WBC 7.7 08/18/2016   HGB 14.6 02/06/2018   HCT 43.0 02/06/2018   PLT 158 08/18/2016   GLUCOSE 112 (H) 02/06/2018   CHOL 264 (H) 08/19/2016   TRIG 189.0 (H) 08/19/2016   HDL 45.90 08/19/2016   LDLDIRECT 178.5 12/06/2013   LDLCALC 180 (H) 08/19/2016   ALT 35 08/18/2016   AST 26 08/18/2016   NA 140 02/06/2018   K 3.8 02/06/2018   CL 107 02/06/2018   CREATININE 0.70 02/06/2018   BUN 17 02/06/2018   CO2 27 08/18/2016   TSH 2.06 08/19/2016   HGBA1C 5.5 08/19/2016    ASSESSMENT AND PLAN:  Discussed the following assessment and plan:    ICD-10-CM   1. Tobacco dependency  F17.200   2. Encounter for tobacco use cessation counseling  Z71.6   3. Bereavement counseling  Z71.89   4. Medication management  Z79.899     Counseled.    F 17210  Advised to quit and impact of smoking willingness to attempt to quit  About 50 %   Providing methods and skills for cessation Medication management of smoking session drugs Resources provided Setting quit date noT yet  Will try adding wellbutrin with holidays and moms anniversary of death  In past helped her depression but willing to try for tobacco cessation Follow-up arranged  VV in 3-4 weeks   Amount of time spent counseling patient 25 minutes    Expectant management and discussion of plan and treatment with opportunity to ask questions and all were answered. The patient agreed with the plan and demonstrated an understanding of the instructions. Return for vitual  visit  in 3-4 weeks. med tobacco and  Other   Advised to call back or seek an in-person evaluation  if needed  I provided 25  minutes of non-face-to-face time during this encounter.   Shanon Ace, MD

## 2019-01-19 ENCOUNTER — Encounter: Payer: Self-pay | Admitting: Internal Medicine

## 2019-01-19 ENCOUNTER — Other Ambulatory Visit: Payer: Self-pay

## 2019-01-19 ENCOUNTER — Telehealth (INDEPENDENT_AMBULATORY_CARE_PROVIDER_SITE_OTHER): Payer: BC Managed Care – PPO | Admitting: Internal Medicine

## 2019-01-19 DIAGNOSIS — F17200 Nicotine dependence, unspecified, uncomplicated: Secondary | ICD-10-CM

## 2019-01-19 DIAGNOSIS — Z716 Tobacco abuse counseling: Secondary | ICD-10-CM | POA: Diagnosis not present

## 2019-01-19 DIAGNOSIS — Z7189 Other specified counseling: Secondary | ICD-10-CM | POA: Diagnosis not present

## 2019-01-19 DIAGNOSIS — Z79899 Other long term (current) drug therapy: Secondary | ICD-10-CM | POA: Diagnosis not present

## 2019-01-19 MED ORDER — BUPROPION HCL ER (XL) 150 MG PO TB24: ORAL_TABLET | ORAL | 1 refills | Status: DC

## 2019-01-23 ENCOUNTER — Ambulatory Visit: Payer: BC Managed Care – PPO | Attending: Internal Medicine | Admitting: Physical Therapy

## 2019-01-23 ENCOUNTER — Other Ambulatory Visit: Payer: Self-pay

## 2019-01-23 DIAGNOSIS — R2681 Unsteadiness on feet: Secondary | ICD-10-CM

## 2019-01-23 DIAGNOSIS — R42 Dizziness and giddiness: Secondary | ICD-10-CM | POA: Diagnosis not present

## 2019-01-24 ENCOUNTER — Encounter: Payer: Self-pay | Admitting: Physical Therapy

## 2019-01-24 NOTE — Therapy (Signed)
[facility] [address] [place], [place], [address] Phone: [phone]   Fax:  [phone]  Physical Therapy Treatment  Patient Details  Name: Elizabeth Tanner MRN: [id number] Date of Birth: [birth year] Referring Provider (PT): Shanon Ace, MD   Encounter Date: 01/23/2019  PT End of Session - 01/24/19 2204    Visit Number  5    Number of Visits  6    Date for PT Re-Evaluation  02/02/19    Authorization Type  BCBS    PT Start Time  0802    PT Stop Time  0846    PT Time Calculation (min)  44 min    Activity Tolerance  Patient tolerated treatment well    Behavior During Therapy  [facility] for tasks assessed/performed       Past Medical History:  Diagnosis Date  . Depression    ? atypical  . Dysmenorrhea   . Fracture of fibula    rt  . Gluten intolerance    self reported  . History of nephrolithiasis   . History of tobacco use   . Hypertension   . Knee injury    fall on snow fracture [place]  . Pneumonia 8 12    LLL -clear now  . Wears glasses     Past Surgical History:  Procedure Laterality Date  . BREAST REDUCTION SURGERY  2001  . MOUTH SURGERY    . ORIF TIBIA PLATEAU Right 05/05/2012   Procedure: OPEN REDUCTION INTERNAL FIXATION TIBIAL PLATEAU;  Surgeon: Wylene Simmer, MD;  Location: [facility];  Service: Orthopedics;  Laterality: Right;    There were no vitals filed for this visit.  Subjective Assessment - 01/24/19 2203    Subjective  Pt states she is doing a little better    Pertinent History  some hypertension (not on medicine).  Previous episodic vertigo per subjective report.    Patient Stated Goals  I don't care about my neck.  I want my vertigo to go away.    Currently in Pain?  No/denies                                 PT Short Term Goals - 01/24/19 2204      PT SHORT TERM GOAL #1   Title  The patient will be indep with HEP for gaze adaptation,  habituation, and high level balance.    Time  4    Period  Weeks    Status  Achieved    Target Date  01/18/19      PT SHORT TERM GOAL #2   Title  The patient will tolerate gaze x 1 viewing x 30 seconds without c/o visual blurring.    Baseline  defer this goal as pt is getting new eye glasses this week - 01-10-19    Time  4    Period  Weeks    Status  Deferred    Target Date  01/18/19      PT SHORT TERM GOAL #3   Title  The patient will tolerate rolling in bed without c/o vertigo or nausea.    Baseline  inconsistent in provoking symptoms - 01-10-19    Time  4    Period  Weeks    Status  Partially Met    Target Date  01/18/19        PT Long Term Goals - 01/24/19 2204  PT LONG TERM GOAL #1   Title  The patient will be indep with progression of HEP.    Time  6    Period  Weeks      PT LONG TERM GOAL #2   Title  The patient  will improve static versus dynamic visual acuity from 5 line difference to < or equal to 3 line difference to demo improving VOR.    Time  6    Period  Weeks      PT LONG TERM GOAL #3   Title  The patient will maintain single leg stance x 10 seconds bilaterally to demo improving balance during narrow base of support.    Time  6    Period  Weeks      PT LONG TERM GOAL #4   Title  The patient will maintain eyes closed 30 seconds on foam with feet apart to demo improved vestibular system use for balance.    Baseline  5 seconds of eyes closed with sway.    Time  6    Period  Weeks              Patient will benefit from skilled therapeutic intervention in order to improve the following deficits and impairments:     Visit Diagnosis: Dizziness and giddiness  Unsteadiness on feet     Problem List Patient Active Problem List   Diagnosis Date Noted  . History of nephrolithiasis   . Visit for preventive health examination 12/13/2013  . Hyperglycemia 12/13/2013  . Amenorrhea 12/13/2013  . Sinusitis 02/06/2013  . Encounter for  management and injection of injectable progestin contraceptive 08/16/2011  . LFTs abnormal minor alt 08/16/2011  . Hypertension 03/18/2011  . Stress at work 03/18/2011  . Hair loss 12/26/2010  . Thyroiditis 12/26/2010  . Dyspnea 11/06/2010  . History of tobacco use 11/06/2010  . Other and unspecified hyperlipidemia 07/31/2010  . Preventative health care 07/30/2010  . Elevated blood pressure reading 07/30/2010  . Depo-Provera contraceptive status 07/16/2010  . 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 NONSPECIFIC ABNORM RESULTS THYROID FUNCT STUDY 11/22/2008  . MYALGIA 11/13/2008  . FATIGUE 11/13/2008  . OBESITY 07/23/2008  . NUMBNESS, ARM 09/12/2007  . NECK PAIN 08/31/2007  . ARM PAIN, RIGHT 08/31/2007  . SLEEPLESSNESS 02/28/2007  . DEPRESSION 11/05/2006  . DYSMENORRHEA 11/05/2006  . IRREGULAR MENSTRUATION 11/05/2006  . ATTENTION DEFICIT DISORDER, HX OF 11/05/2006    Alda Lea, PT 01/24/2019, 10:05 PM  [facility] [address] [place], [place], [address] Phone: [phone]   Fax:  [phone]  Name: Elizabeth Tanner MRN: [id number] Date of Birth: 11-Jan-1971

## 2019-01-31 ENCOUNTER — Ambulatory Visit: Payer: BC Managed Care – PPO | Admitting: Physical Therapy

## 2019-01-31 ENCOUNTER — Other Ambulatory Visit: Payer: Self-pay

## 2019-01-31 VITALS — BP 134/98 | HR 98

## 2019-01-31 DIAGNOSIS — R42 Dizziness and giddiness: Secondary | ICD-10-CM

## 2019-02-01 ENCOUNTER — Telehealth (INDEPENDENT_AMBULATORY_CARE_PROVIDER_SITE_OTHER): Payer: BC Managed Care – PPO | Admitting: Internal Medicine

## 2019-02-01 ENCOUNTER — Encounter: Payer: Self-pay | Admitting: Internal Medicine

## 2019-02-01 DIAGNOSIS — Z8679 Personal history of other diseases of the circulatory system: Secondary | ICD-10-CM | POA: Diagnosis not present

## 2019-02-01 DIAGNOSIS — F17200 Nicotine dependence, unspecified, uncomplicated: Secondary | ICD-10-CM | POA: Diagnosis not present

## 2019-02-01 DIAGNOSIS — Z79899 Other long term (current) drug therapy: Secondary | ICD-10-CM | POA: Diagnosis not present

## 2019-02-01 DIAGNOSIS — R030 Elevated blood-pressure reading, without diagnosis of hypertension: Secondary | ICD-10-CM | POA: Diagnosis not present

## 2019-02-01 NOTE — Progress Notes (Signed)
Virtual Visit via Video Note  I connected with@ on 02/01/19 at  9:30 AM EST by a video enabled telemedicine application and verified that I am speaking with the correct person using two identifiers. Location patient: home Location provider:work office Persons participating in the virtual visit: patient, provider  [place] national recommendations  regarding COVID 19 pandemic   video visit is advised over in office visit for this patient.  Patient aware  of the limitations of evaluation and management by telemedicine and  availability of in person appointments. and agreed to proceed.   HPI: Elizabeth Tanner presents for video visit fo concerns about BP readings she states that yesterday she felt a bit lightheaded at her rehab physical therapy blood pressure was taken was 130s 6/107 on repeat on manual 130/98.  She took her wrist machine reading today 156/105 but not sure that the monitor is accurate. She takes Benadryl and melatonin at night last night did not take the Benadryl but took a muscle relaxer instead. No neurologic symptoms otherwise no presyncopal or tachycardia. Was starting the Wellbutrin but has not taken it recently because of other concerns States that her blood pressure does tend to go up when she is worried. In the remote past with giving her losartan and she stated that her "legs would not work" so she stopped in the past and has lost a good deal of weight from 1 70-1 48 in the last 6 months to year.  She is trying to stop smoking and has cut down. Has been trying to walk around the block and has no associated symptoms at that point but otherwise is fairly sedentary.      ROS: See pertinent positives and negatives per HPI.  Past Medical History:  Diagnosis Date  . Depression    ? atypical  . Dysmenorrhea   . Fracture of fibula    rt  . Gluten intolerance    self reported  . History of nephrolithiasis   . History of tobacco use   . Hypertension   . Knee injury     fall on snow fracture hewitt  . Pneumonia 8 12    LLL -clear now  . Wears glasses     Past Surgical History:  Procedure Laterality Date  . BREAST REDUCTION SURGERY  2001  . MOUTH SURGERY    . ORIF TIBIA PLATEAU Right 05/05/2012   Procedure: OPEN REDUCTION INTERNAL FIXATION TIBIAL PLATEAU;  Surgeon: Wylene Simmer, MD;  Location: [facility];  Service: Orthopedics;  Laterality: Right;    Family History  Problem Relation Age of Onset  . Alcohol abuse Other   . Thyroid disease Other   . Arthritis Other        Father has a fused spine uncle with DJD  . Heart attack Maternal Uncle 30       Electrical problem also maternal aunt  . Melanoma Cousin   . Other Maternal Aunt        68 pacer.   . Cancer Mother   . Diabetes Father   . Osteoarthritis Father   . Hypertension Father     Social History   Tobacco Use  . Smoking status: Former Smoker    Packs/day: 1.50    Years: 8.00    Pack years: 12.00    Types: Cigarettes  . Smokeless tobacco: Never Used  Substance Use Topics  . Alcohol use: Yes    Alcohol/week: 0.0 standard drinks    Comment: occ  .  Drug use: No      Current Outpatient Medications:  .  buPROPion (WELLBUTRIN XL) 150 MG 24 hr tablet, Take 1 by mouth daily , increase to 2 by mouth  in am after 1 week, Disp: 60 tablet, Rfl: 1 .  cholecalciferol (VITAMIN D3) 25 MCG (1000 UT) tablet, Take 1,000 Units by mouth daily., Disp: , Rfl:  .  meclizine (ANTIVERT) 25 MG tablet, Take 0.5-1 tablets (12.5-25 mg total) by mouth 3 (three) times daily as needed for dizziness. (Patient not taking: Reported on 12/29/2018), Disp: 30 tablet, Rfl: 0 .  methylPREDNISolone (MEDROL DOSEPAK) 4 MG TBPK tablet, tad (Patient not taking: Reported on 12/19/2018), Disp: 21 tablet, Rfl: 0 .  tiZANidine (ZANAFLEX) 4 MG tablet, Take 1-2 tablets (4-8 mg total) by mouth every 6 (six) hours as needed for muscle spasms. (Patient not taking: Reported on 12/19/2018), Disp: 21 tablet, Rfl:  0  EXAM: BP Readings from Last 3 Encounters:  01/31/19 (!) 134/98  08/26/18 (!) 154/96  02/06/18 (!) 171/123    VITALS per patient if applicable:reported  [id number] wrist cuff ?    GENERAL: alert, oriented, appears well and in no acute distress she appears mildly anxious but cognitively intact normal speech and thought. When taking her blood pressure with the wrist monitor her wrist was not at heart level.  HEENT: atraumatic, conjunttiva clear, no obvious abnormalities on inspection of external nose and ears  NECK: normal movements of the head and neck  LUNGS: on inspection no signs of respiratory distress, breathing rate appears normal, no obvious gross SOB, gasping or wheezing  CV: no obvious cyanosis  PSYCH/NEURO: pleasant and cooperative, no obvious depression or anxiety, speech and thought processing grossly intact Lab Results  Component Value Date   WBC 7.7 08/18/2016   HGB 14.6 02/06/2018   HCT 43.0 02/06/2018   PLT 158 08/18/2016   GLUCOSE 112 (H) 02/06/2018   CHOL 264 (H) 08/19/2016   TRIG 189.0 (H) 08/19/2016   HDL 45.90 08/19/2016   LDLDIRECT 178.5 12/06/2013   LDLCALC 180 (H) 08/19/2016   ALT 35 08/18/2016   AST 26 08/18/2016   NA 140 02/06/2018   K 3.8 02/06/2018   CL 107 02/06/2018   CREATININE 0.70 02/06/2018   BUN 17 02/06/2018   CO2 27 08/18/2016   TSH 2.06 08/19/2016   HGBA1C 5.5 08/19/2016    ASSESSMENT AND PLAN:  Discussed the following assessment and plan:    ICD-10-CM   1. Elevated blood pressure reading  A999333    prob diasatolic  ht see plan  2. History of hypertension  Z86.79   3. Tobacco dependency  F17.200   4. Medication management  Z79.899    Based on her readings she may have diastolic hypertension and discussed getting more accurate monitor send in 3 to 5 days of twice daily readings and then consider adding medication such as low-dose HCTZ.  And then plan follow-up. In regard to the lightheadedness I am not sure it was  cardiovascular related could be medication related and she may try stopping the Benadryl at night Plan follow-up in person if needed depending on progress. Did warn and she is aware of blood pressure elevations with self monitoring and she could let us know if that is happening. Continue healthy weight loss walking is okay at this time staying hydrated etc. Counseled.   Expectant management and discussion of plan and treatment with opportunity to ask questions and all were answered. The patient agreed with the plan and  demonstrated an understanding of the instructions.   Advised to call back or seek an in-person evaluation if worsening  or having  further concerns .    Shanon Ace, MD

## 2019-02-01 NOTE — Therapy (Signed)
[facility] [address] [place], [place], [address] Phone: [phone]   Fax:  [phone]  Physical Therapy Treatment  Patient Details  Name: Elizabeth Tanner MRN: [id number] Date of Birth: 02/02/1971 Referring Provider (PT): Shanon Ace, MD   Encounter Date: 01/31/2019  PT End of Session - 02/01/19 1949    Visit Number  6    Number of Visits  6    Date for PT Re-Evaluation  02/02/19    Authorization Type  BC[employer]    PT Start Time  1146    PT Stop Time  8937   session ended early due to elevated BP   PT Time Calculation (min)  19 min    Activity Tolerance  Treatment limited secondary to medical complications (Comment);Other (comment)   elevated BP - 134/107      Past Medical History:  Diagnosis Date  . Depression    ? atypical  . Dysmenorrhea   . Fracture of fibula    rt  . Gluten intolerance    self reported  . History of nephrolithiasis   . History of tobacco use   . Hypertension   . Knee injury    fall on snow fracture hewitt  . Pneumonia 8 12    LLL -clear now  . Wears glasses     Past Surgical History:  Procedure Laterality Date  . BREAST REDUCTION SURGERY  2001  . MOUTH SURGERY    . ORIF TIBIA PLATEAU Right 05/05/2012   Procedure: OPEN REDUCTION INTERNAL FIXATION TIBIAL PLATEAU;  Surgeon: Wylene Simmer, MD;  Location: [facility];  Service: Orthopedics;  Laterality: Right;    Vitals:   01/31/19 1156 01/31/19 1200  BP: (!) 133/107 (!) 134/98  Pulse: 98     Subjective Assessment - 02/01/19 1945    Subjective  Pt states she is lightheaded and feels as if she is in a fog today    Patient Stated Goals  I don't care about my neck.  I want my vertigo to go away.    Currently in Pain?  No/denies                       [facility] Adult PT Treatment/Exercise - 02/01/19 0001      Self-Care   Self-Care  Other Self-Care Comments    Other Self-Care Comments   BP recorded at  start of session due to pt reporting lightheadedness and foggy feeling in her head;  BP was elevated with reading taken with automatic cuff and also elevated with manual cuff at 130/98;; 134/107 with automatic cuff; discussed need for pt to follow up with MD regarding elevated BP readings - PT activities and exercises withheld today due to elevated BP               PT Short Term Goals - 02/01/19 1953      PT SHORT TERM GOAL #1   Title  The patient will be indep with HEP for gaze adaptation, habituation, and high level balance.    Time  4    Period  Weeks    Status  Achieved    Target Date  01/18/19      PT SHORT TERM GOAL #2   Title  The patient will tolerate gaze x 1 viewing x 30 seconds without c/o visual blurring.    Baseline  defer this goal as pt is getting new eye glasses this week - 01-10-19  Time  4    Period  Weeks    Status  Deferred    Target Date  01/18/19      PT SHORT TERM GOAL #3   Title  The patient will tolerate rolling in bed without c/o vertigo or nausea.    Baseline  inconsistent in provoking symptoms - 01-10-19    Time  4    Period  Weeks    Status  Partially Met    Target Date  01/18/19        PT Long Term Goals - 02/01/19 1953      PT LONG TERM GOAL #1   Title  The patient will be indep with progression of HEP.    Time  6    Period  Weeks      PT LONG TERM GOAL #2   Title  The patient  will improve static versus dynamic visual acuity from 5 line difference to < or equal to 3 line difference to demo improving VOR.    Time  6    Period  Weeks      PT LONG TERM GOAL #3   Title  The patient will maintain single leg stance x 10 seconds bilaterally to demo improving balance during narrow base of support.    Time  6    Period  Weeks      PT LONG TERM GOAL #4   Title  The patient will maintain eyes closed 30 seconds on foam with feet apart to demo improved vestibular system use for balance.    Baseline  5 seconds of eyes closed with sway.     Time  6    Period  Weeks            Plan - 02/01/19 1951    Clinical Impression Statement  PT session was held after BP reading of 134/107 was recorded at beginning of session with pt reporting lightheadedness and foggy feeling in head.  Recommended pt to follow up with MD regarding elevated BP readings.    PT Frequency  1x / week    PT Duration  6 weeks    PT Next Visit Plan  check upgraded HEP - cont vestibular exs.    PT Home Exercise Plan  amb. with head turns, marching on pillow, tracking ball during ambulation       Patient will benefit from skilled therapeutic intervention in order to improve the following deficits and impairments:     Visit Diagnosis: Dizziness and giddiness     Problem List Patient Active Problem List   Diagnosis Date Noted  . History of nephrolithiasis   . Visit for preventive health examination 12/13/2013  . Hyperglycemia 12/13/2013  . Amenorrhea 12/13/2013  . Sinusitis 02/06/2013  . Encounter for management and injection of injectable progestin contraceptive 08/16/2011  . LFTs abnormal minor alt 08/16/2011  . Hypertension 03/18/2011  . Stress at work 03/18/2011  . Hair loss 12/26/2010  . Thyroiditis 12/26/2010  . Dyspnea 11/06/2010  . History of tobacco use 11/06/2010  . Other and unspecified hyperlipidemia 07/31/2010  . Preventative health care 07/30/2010  . Elevated blood pressure reading 07/30/2010  . Depo-Provera contraceptive status 07/16/2010  . 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 NONSPECIFIC ABNORM RESULTS THYROID FUNCT STUDY 11/22/2008  . MYALGIA 11/13/2008  . FATIGUE 11/13/2008  . OBESITY 07/23/2008  . NUMBNESS, ARM 09/12/2007  . NECK PAIN 08/31/2007  . ARM PAIN, RIGHT 08/31/2007  . SLEEPLESSNESS 02/28/2007  .  DEPRESSION 11/05/2006  . DYSMENORRHEA 11/05/2006  . IRREGULAR MENSTRUATION 11/05/2006  . ATTENTION DEFICIT DISORDER, HX OF 11/05/2006    Alda Lea, PT 02/01/2019, 7:55  PM  [facility] [address] [place], [place], [address] Phone: [phone]   Fax:  [phone]  Name: Elizabeth Tanner MRN: [id number] Date of Birth: March 27, 1970

## 2019-02-03 ENCOUNTER — Ambulatory Visit: Payer: BC Managed Care – PPO | Admitting: Internal Medicine

## 2019-02-06 ENCOUNTER — Ambulatory Visit: Payer: BC Managed Care – PPO | Admitting: Physical Therapy

## 2019-04-17 DIAGNOSIS — Z87442 Personal history of urinary calculi: Secondary | ICD-10-CM

## 2019-04-17 HISTORY — DX: Personal history of urinary calculi: Z87.442

## 2019-05-06 ENCOUNTER — Telehealth: Payer: Self-pay | Admitting: Emergency Medicine

## 2019-05-06 ENCOUNTER — Ambulatory Visit (INDEPENDENT_AMBULATORY_CARE_PROVIDER_SITE_OTHER)
Admission: RE | Admit: 2019-05-06 | Discharge: 2019-05-06 | Disposition: A | Discharge status: Home / Self Care | Payer: BC Managed Care – PPO | Source: Ambulatory Visit

## 2019-05-06 DIAGNOSIS — R300 Dysuria: Secondary | ICD-10-CM | POA: Diagnosis not present

## 2019-05-06 DIAGNOSIS — R350 Frequency of micturition: Secondary | ICD-10-CM | POA: Diagnosis not present

## 2019-05-06 DIAGNOSIS — R61 Generalized hyperhidrosis: Secondary | ICD-10-CM | POA: Diagnosis not present

## 2019-05-06 MED ORDER — CIPROFLOXACIN HCL 500 MG PO TABS: 500.0000 mg | ORAL_TABLET | Freq: Two times a day (BID) | ORAL | 0 refills | Status: AC

## 2019-05-06 MED ORDER — CIPROFLOXACIN HCL 500 MG PO TABS: 500.0000 mg | ORAL_TABLET | Freq: Two times a day (BID) | ORAL | 0 refills | Status: DC

## 2019-05-06 NOTE — Telephone Encounter (Signed)
Sent RX to different pharmacy

## 2019-05-06 NOTE — ED Provider Notes (Signed)
[facility]   Virtual Visit via Video Note:  MAGDLENE PRAK  initiated request for Telemedicine visit with [facility] team. I connected with Flint Melter  on 05/06/2019 at 11:19 AM  for a synchronized telemedicine visit using a video enabled HIPPA compliant telemedicine application. I verified that I am speaking with Flint Melter  using two identifiers. Lestine Box, PA-C  was physically located in a [facility] Urgent care site and SAWSAN REHL was located at a different location.   The limitations of evaluation and management by telemedicine as well as the availability of in-person appointments were discussed. Patient was informed that she  may incur a bill ( including co-pay) for this virtual visit encounter. Flint Melter  expressed understanding and gave verbal consent to proceed with virtual visit.   [id number] 05/06/19 Arrival Time: 1105  CC: Concern for UTI  SUBJECTIVE:  Elizabeth Tanner is a 49 y.o. female who complains of subjective fever, night sweats, urinary frequency, and dysuria x couple of days.  Symptoms began after passing kidney stones 2 days ago.  Complains of associated bladder spasm/ pressure.  Has tried OTC acetaminophen without relief.  Symptoms are made worse with urination.  Admits to similar symptoms in the past with UTI.  Denies nausea, vomiting, chest pain, SOB, flank pain, abnormal vaginal discharge, vaginal itching, vaginal odor.    LMP: No LMP recorded. Patient is premenopausal.  ROS: As in HPI.  All other pertinent ROS negative.     Past Medical History:  Diagnosis Date  . Depression    ? atypical  . Dysmenorrhea   . Fracture of fibula    rt  . Gluten intolerance    self reported  . History of nephrolithiasis   . History of tobacco use   . Hypertension   . Knee injury    fall on snow fracture hewitt  . Pneumonia 8 12    LLL -clear now  . Wears glasses    Past Surgical History:  Procedure Laterality Date  . BREAST  REDUCTION SURGERY  2001  . MOUTH SURGERY    . ORIF TIBIA PLATEAU Right 05/05/2012   Procedure: OPEN REDUCTION INTERNAL FIXATION TIBIAL PLATEAU;  Surgeon: Wylene Simmer, MD;  Location: [facility];  Service: Orthopedics;  Laterality: Right;   Allergies  Allergen Reactions  . Azithromycin Itching    Reports similar to erythromycin  . Amoxicillin Itching  . Gluten   . Vicodin [Hydrocodone-Acetaminophen] Nausea Only  . Penicillins Rash   No current facility-administered medications on file prior to encounter.   Current Outpatient Medications on File Prior to Encounter  Medication Sig Dispense Refill  . buPROPion (WELLBUTRIN XL) 150 MG 24 hr tablet Take 1 by mouth daily , increase to 2 by mouth  in am after 1 week 60 tablet 1  . cholecalciferol (VITAMIN D3) 25 MCG (1000 UT) tablet Take 1,000 Units by mouth daily.      OBJECTIVE:  There were no vitals filed for this visit.  General appearance: alert; no distress Eyes: EOMI grossly HENT: normocephalic; atraumatic Neck: supple with FROM Lungs: normal respiratory effort; speaking in full sentences without difficulty Extremities: moves extremities without difficulty Skin: No obvious rashes Neurologic: No facial asymmetries Psychological: alert and cooperative; normal mood and affect  ASSESSMENT & PLAN:  1. Dysuria   2. Urinary frequency   3. Night sweats     Meds ordered this encounter  Medications  . ciprofloxacin (CIPRO)  500 MG tablet    Sig: Take 1 tablet (500 mg total) by mouth every 12 (twelve) hours for 7 days.    Dispense:  14 tablet    Refill:  0    Order Specific Question:   Supervising Provider    Answer:   Raylene Everts [id number]   Symptoms consistent with UTI Push fluids and get plenty of rest.   Take antibiotic as directed and to completion Follow up in person if symptoms persists Follow up in person or go to ER if you have any new or worsening symptoms such as fever, worsening abdominal pain,  nausea/vomiting, flank pain, persistent symptoms despite medication, decrease in urine output, etc...  I discussed the assessment and treatment plan with the patient. The patient was provided an opportunity to ask questions and all were answered. The patient agreed with the plan and demonstrated an understanding of the instructions.   The patient was advised to call back or seek an in-person evaluation if the symptoms worsen or if the condition fails to improve as anticipated.  I provided 12 minutes of non-face-to-face time during this encounter.  Miller, PA-C  05/06/2019 11:19 AM      Lestine Box, PA-C 05/06/19 1119

## 2019-05-06 NOTE — Discharge Instructions (Addendum)
Symptoms consistent with UTI Push fluids and get plenty of rest.   Take antibiotic as directed and to completion Follow up in person if symptoms persists Follow up in person or go to ER if you have any new or worsening symptoms such as fever, worsening abdominal pain, nausea/vomiting, flank pain, persistent symptoms despite medication, decrease in urine output, etc..Marland Kitchen

## 2019-05-07 ENCOUNTER — Emergency Department (HOSPITAL_COMMUNITY): Payer: BC Managed Care – PPO

## 2019-05-07 ENCOUNTER — Emergency Department (HOSPITAL_COMMUNITY)
Admission: EM | Admit: 2019-05-07 | Discharge: 2019-05-08 | Disposition: A | Discharge status: Home / Self Care | Payer: BC Managed Care – PPO | Attending: Emergency Medicine | Admitting: Emergency Medicine

## 2019-05-07 ENCOUNTER — Other Ambulatory Visit: Payer: Self-pay

## 2019-05-07 ENCOUNTER — Encounter (HOSPITAL_COMMUNITY): Payer: Self-pay | Admitting: Obstetrics and Gynecology

## 2019-05-07 DIAGNOSIS — Z79899 Other long term (current) drug therapy: Secondary | ICD-10-CM | POA: Diagnosis not present

## 2019-05-07 DIAGNOSIS — Z87891 Personal history of nicotine dependence: Secondary | ICD-10-CM | POA: Diagnosis not present

## 2019-05-07 DIAGNOSIS — N10 Acute tubulo-interstitial nephritis: Secondary | ICD-10-CM | POA: Insufficient documentation

## 2019-05-07 DIAGNOSIS — N12 Tubulo-interstitial nephritis, not specified as acute or chronic: Secondary | ICD-10-CM

## 2019-05-07 DIAGNOSIS — Z20822 Contact with and (suspected) exposure to covid-19: Secondary | ICD-10-CM | POA: Diagnosis not present

## 2019-05-07 DIAGNOSIS — N200 Calculus of kidney: Secondary | ICD-10-CM

## 2019-05-07 DIAGNOSIS — I10 Essential (primary) hypertension: Secondary | ICD-10-CM | POA: Insufficient documentation

## 2019-05-07 DIAGNOSIS — R1032 Left lower quadrant pain: Secondary | ICD-10-CM | POA: Diagnosis present

## 2019-05-07 LAB — URINALYSIS, ROUTINE W REFLEX MICROSCOPIC
Bacteria, UA: NONE SEEN
Bilirubin Urine: NEGATIVE
Glucose, UA: NEGATIVE mg/dL
Ketones, ur: NEGATIVE mg/dL
Leukocytes,Ua: NEGATIVE
Nitrite: NEGATIVE
Protein, ur: NEGATIVE mg/dL
Specific Gravity, Urine: 1.004 — ABNORMAL LOW (ref 1.005–1.030)
pH: 7 (ref 5.0–8.0)

## 2019-05-07 LAB — BASIC METABOLIC PANEL
Anion gap: 14 (ref 5–15)
BUN: 17 mg/dL (ref 6–20)
CO2: 24 mmol/L (ref 22–32)
Calcium: 9.5 mg/dL (ref 8.9–10.3)
Chloride: 93 mmol/L — ABNORMAL LOW (ref 98–111)
Creatinine, Ser: 0.78 mg/dL (ref 0.44–1.00)
GFR calc Af Amer: >60 mL/min (ref >60)
GFR calc non Af Amer: >60 mL/min (ref >60)
Glucose, Bld: 95 mg/dL (ref 70–99)
Potassium: 3.3 mmol/L — ABNORMAL LOW (ref 3.5–5.1)
Sodium: 131 mmol/L — ABNORMAL LOW (ref 135–145)

## 2019-05-07 LAB — LACTIC ACID, PLASMA: Lactic Acid, Venous: 0.9 mmol/L (ref 0.5–1.9)

## 2019-05-07 LAB — CBC
HCT: 39.2 % (ref 36.0–46.0)
Hemoglobin: 13.6 g/dL (ref 12.0–15.0)
MCH: 30.8 pg (ref 26.0–34.0)
MCHC: 34.7 g/dL (ref 30.0–36.0)
MCV: 88.7 fL (ref 80.0–100.0)
Platelets: 170 10*3/uL (ref 150–400)
RBC: 4.42 MIL/uL (ref 3.87–5.11)
RDW: 11.7 % (ref 11.5–15.5)
WBC: 16.3 10*3/uL — ABNORMAL HIGH (ref 4.0–10.5)
nRBC: 0 % (ref 0.0–0.2)

## 2019-05-07 LAB — I-STAT BETA HCG BLOOD, ED (MC, WL, AP ONLY): I-stat hCG, quantitative: 61.6 m[IU]/mL — ABNORMAL HIGH (ref <5)

## 2019-05-07 LAB — HCG, QUANTITATIVE, PREGNANCY: hCG, Beta Chain, Quant, S: 3 m[IU]/mL (ref <5)

## 2019-05-07 MED ORDER — SODIUM CHLORIDE 0.9 % IV BOLUS
1000.0000 mL | Freq: Once | INTRAVENOUS | Status: AC
  Administered 2019-05-07: 1000 mL via INTRAVENOUS

## 2019-05-07 MED ORDER — ONDANSETRON 4 MG PO TBDP: 4.0000 mg | ORAL_TABLET | Freq: Three times a day (TID) | ORAL | 0 refills | Status: DC | PRN

## 2019-05-07 MED ORDER — KETOROLAC TROMETHAMINE 30 MG/ML IJ SOLN
30.0000 mg | Freq: Once | INTRAMUSCULAR | Status: AC
  Administered 2019-05-07: 30 mg via INTRAVENOUS
  Filled 2019-05-07: qty 1

## 2019-05-07 MED ORDER — ACETAMINOPHEN 325 MG PO TABS
650.0000 mg | ORAL_TABLET | Freq: Once | ORAL | Status: AC
  Administered 2019-05-07: 22:00:00 650 mg via ORAL
  Filled 2019-05-07: qty 2

## 2019-05-07 MED ORDER — TRAMADOL HCL 50 MG PO TABS: 50.0000 mg | ORAL_TABLET | Freq: Four times a day (QID) | ORAL | 0 refills | Status: DC | PRN

## 2019-05-07 NOTE — ED Triage Notes (Signed)
Pt reports fever and kidney stones. Patient reports she has passed 4 kidney stones since Wednesday.Pt endorses nausea and emesis.

## 2019-05-07 NOTE — ED Provider Notes (Signed)
Patient signed out at end of shift from Dr. Alvino Chapel pending re-evaluation after fluids, Tylenol, fever recheck. ?admission for pyelonephritis (CT evidence) in the setting of stones. Started on Cipro yesterday by televisit.  Has been passing kidney stones all week - 4-5 stones. Yesterday started having a fever and televisit provider started Cipro. Lactic acid pending.   10:15 - Introduction and recheck  Fluids are running, recheck temp 99.7. Tylenol given. She reports pain is up and down. Toradol ordered IV.   Discussed admission vs discharge home. She prefers discharge home if considered appropriate. Will reassess after fluids are completed, lactic acid is resulted.   11:20 - Normal Lactic acid. On re-evaluation, fluids are done. Toradol has provided some pain relief. No further nausea.   CT reviewed. There are renal stones bilaterally 3-4 mm. No obstructing stones or hydronephrosis. Left perinephric stranding. VSS.   She is very well appearing. Will have her continue Cipro for the entire course. Will provide Zofran and # 8 Ultram for pain. Recommend urology follow up. Strict return precautions discussed and patient is felt reliable to return if she worsens. Discussed part of urology follow up will be the recommended nonemergent [place] given abnormalities of the left kidney, felt likely sequela or prior infection or reflux nephropathy.    Charlann Lange, PA-C 05/07/19 2342    Davonna Belling, MD 05/10/19 [phone]

## 2019-05-07 NOTE — ED Notes (Signed)
Pt transported to CT ?

## 2019-05-07 NOTE — Discharge Instructions (Signed)
Please follow up with urology for recheck of current illness, as well as for a recommended outpatient ultrasound to further evaluate abnormal appearance of the left kidney.   Continue your Cipro antibiotic until gone. Take Ultram for pain as needed, and Zofran for nausea.   It is important to return to the emergency department if you develop a high fever, have severe pain, uncontrolled nausea/vomiting.

## 2019-05-07 NOTE — ED Provider Notes (Addendum)
[facility] DEPT Provider Note   CSN: [id number] Arrival date & time: 05/07/19  2000     History Chief Complaint  Patient presents with  . Flank Pain    Elizabeth Tanner is a 49 y.o. female.  HPI Patient with history of kidney stones.  States that over the last few days she is passed around 4 stones and around 3 days ago began to have fevers.  States she is having pain in her left flank to go down to the abdomen and then she will pass some blood.  Also now has some pain on the right side.  Has had some nausea.  Pain feels like previous kidney stones.  Called in urgent care for virtual visit yesterday and was started on Cipro for possible infection.  Now has had nausea and some vomiting.  States she has not eaten much today.    Past Medical History:  Diagnosis Date  . Depression    ? atypical  . Dysmenorrhea   . Fracture of fibula    rt  . Gluten intolerance    self reported  . History of nephrolithiasis   . History of tobacco use   . Hypertension   . Knee injury    fall on snow fracture hewitt  . Pneumonia 8 12    LLL -clear now  . Wears glasses     Patient Active Problem List   Diagnosis Date Noted  . History of nephrolithiasis   . Visit for preventive health examination 12/13/2013  . Hyperglycemia 12/13/2013  . Amenorrhea 12/13/2013  . Sinusitis 02/06/2013  . Encounter for management and injection of injectable progestin contraceptive 08/16/2011  . LFTs abnormal minor alt 08/16/2011  . Hypertension 03/18/2011  . Stress at work 03/18/2011  . Hair loss 12/26/2010  . Thyroiditis 12/26/2010  . Dyspnea 11/06/2010  . History of tobacco use 11/06/2010  . Other and unspecified hyperlipidemia 07/31/2010  . Preventative health care 07/30/2010  . Elevated blood pressure reading 07/30/2010  . Depo-Provera contraceptive status 07/16/2010  . 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 NONSPECIFIC ABNORM RESULTS THYROID  FUNCT STUDY 11/22/2008  . MYALGIA 11/13/2008  . FATIGUE 11/13/2008  . OBESITY 07/23/2008  . NUMBNESS, ARM 09/12/2007  . NECK PAIN 08/31/2007  . ARM PAIN, RIGHT 08/31/2007  . SLEEPLESSNESS 02/28/2007  . DEPRESSION 11/05/2006  . DYSMENORRHEA 11/05/2006  . IRREGULAR MENSTRUATION 11/05/2006  . ATTENTION DEFICIT DISORDER, HX OF 11/05/2006    Past Surgical History:  Procedure Laterality Date  . BREAST REDUCTION SURGERY  2001  . MOUTH SURGERY    . ORIF TIBIA PLATEAU Right 05/05/2012   Procedure: OPEN REDUCTION INTERNAL FIXATION TIBIAL PLATEAU;  Surgeon: Wylene Simmer, MD;  Location: [facility];  Service: Orthopedics;  Laterality: Right;     OB History   No obstetric history on file.     Family History  Problem Relation Age of Onset  . Alcohol abuse Other   . Thyroid disease Other   . Arthritis Other        Father has a fused spine uncle with DJD  . Heart attack Maternal Uncle 30       Electrical problem also maternal aunt  . Melanoma Cousin   . Other Maternal Aunt        68 pacer.   . Cancer Mother   . Diabetes Father   . Osteoarthritis Father   . Hypertension Father     Social History   Tobacco  Use  . Smoking status: Former Smoker    Packs/day: 1.50    Years: 8.00    Pack years: 12.00    Types: Cigarettes  . Smokeless tobacco: Never Used  Substance Use Topics  . Alcohol use: Yes    Alcohol/week: 0.0 standard drinks    Comment: occ  . Drug use: No    Home Medications Prior to Admission medications   Medication Sig Start Date End Date Taking? Authorizing Provider  buPROPion (WELLBUTRIN XL) 150 MG 24 hr tablet Take 1 by mouth daily , increase to 2 by mouth  in am after 1 week 01/19/19   Panosh, Standley Brooking, MD  cholecalciferol (VITAMIN D3) 25 MCG (1000 UT) tablet Take 1,000 Units by mouth daily.    [provider]  ciprofloxacin (CIPRO) 500 MG tablet Take 1 tablet (500 mg total) by mouth every 12 (twelve) hours for 7 days. 05/06/19 05/13/19   Lestine Box, PA-C    Allergies    Azithromycin, Amoxicillin, Gluten, Vicodin [hydrocodone-acetaminophen], and Penicillins  Review of Systems   Review of Systems  Constitutional: Positive for fever.  HENT: Negative for congestion.   Respiratory: Negative for shortness of breath.   Cardiovascular: Negative for chest pain.  Gastrointestinal: Positive for abdominal pain, nausea and vomiting.  Genitourinary: Positive for dysuria, flank pain and hematuria.  Musculoskeletal: Negative for back pain.  Neurological: Negative for weakness.  Psychiatric/Behavioral: Negative for agitation.    Physical Exam Updated Vital Signs BP 110/80   Pulse 96   Temp (!) 101.1 F (38.4 C) (Oral)   Resp 19   SpO2 98%   Physical Exam Vitals and nursing note reviewed.  HENT:     Head: Atraumatic.  Eyes:     Pupils: Pupils are equal, round, and reactive to light.  Cardiovascular:     Rate and Rhythm: Regular rhythm.  Pulmonary:     Breath sounds: No wheezing, rhonchi or rales.  Abdominal:     Tenderness: There is no abdominal tenderness.  Genitourinary:    Comments: CVA tenderness on left. Musculoskeletal:     Right lower leg: No edema.     Left lower leg: No edema.  Skin:    General: Skin is warm.     Capillary Refill: Capillary refill takes less than 2 seconds.  Neurological:     Mental Status: She is alert and oriented to person, place, and time.     ED Results / Procedures / Treatments   Labs (all labs ordered are listed, but only abnormal results are displayed) Labs Reviewed  URINALYSIS, ROUTINE W REFLEX MICROSCOPIC - Abnormal; Notable for the following components:      Result Value   Color, Urine STRAW (*)    Specific Gravity, Urine 1.004 (*)    Hgb urine dipstick MODERATE (*)    All other components within normal limits  BASIC METABOLIC PANEL - Abnormal; Notable for the following components:   Sodium 131 (*)    Potassium 3.3 (*)    Chloride 93 (*)    All other  components within normal limits  CBC - Abnormal; Notable for the following components:   WBC 16.3 (*)    All other components within normal limits  I-STAT BETA HCG BLOOD, ED ([facility], [facility], [facility] ONLY) - Abnormal; Notable for the following components:   I-stat hCG, quantitative 61.6 (*)    All other components within normal limits  CULTURE, BLOOD (ROUTINE X 2)  CULTURE, BLOOD (ROUTINE X 2)  URINE CULTURE  SARS  CORONAVIRUS 2 (TAT 6-24 HRS)  LACTIC ACID, PLASMA  LACTIC ACID, PLASMA  HCG, QUANTITATIVE, PREGNANCY    EKG None  Radiology CT Renal Stone Study  Result Date: 05/07/2019 CLINICAL DATA:  49 year old female with flank pain. EXAM: CT ABDOMEN AND PELVIS WITHOUT CONTRAST TECHNIQUE: Multidetector CT imaging of the abdomen and pelvis was performed following the standard protocol without IV contrast. COMPARISON:  Abdominal MRI dated 05/19/2014. FINDINGS: Evaluation of this exam is limited in the absence of intravenous contrast. Lower chest: Left lung base linear atelectasis/scarring. The visualized lung bases are otherwise clear. No intra-abdominal free air or free fluid. Hepatobiliary: Fatty infiltration of the liver. No intrahepatic biliary ductal dilatation. The gallbladder is unremarkable. Pancreas: Unremarkable. No pancreatic ductal dilatation or surrounding inflammatory changes. Spleen: Normal in size without focal abnormality. Adrenals/Urinary Tract: The adrenal glands are unremarkable. Left renal upper pole cortical scarring and parenchymal atrophy. This is likely related to infection or reflux nephropathy. Apparent focal prominence of the anterior aspect of the upper pole of the left kidney measuring 3.7 x 3.5 cm (series 2, image 27) likely a slightly prominent renal parenchyma. A mass is less likely. Further evaluation with ultrasound on a nonemergent basis recommended. Multiple small nonobstructing bilateral renal calculi measuring up to 3-4 mm in the inferior pole of the left kidney. There is  no hydronephrosis on either side. Mild left perinephric stranding and haziness noted. Correlation with urinalysis recommended to exclude pyelonephritis. The visualized ureters and urinary bladder appear unremarkable. Stomach/Bowel: There is no bowel obstruction or active inflammation. The appendix is normal. Vascular/Lymphatic: The abdominal aorta and IVC are unremarkable. No portal venous gas. There is no adenopathy. Reproductive: The uterus and ovaries are grossly unremarkable as visualized. Other: None Musculoskeletal: No acute or significant osseous findings. IMPRESSION: 1. Small nonobstructing bilateral renal calculi. No hydronephrosis or obstructing stone. 2. Mild left perinephric stranding. Correlation with urinalysis recommended to exclude pyelonephritis. 3. Cortical scarring and parenchyma atrophy of the upper pole of the left kidney likely sequela of prior infection or reflux nephropathy. Apparent prominent soft tissue in the anterior upper pole of the left kidney likely normal renal parenchyma. Further evaluation with ultrasound on a nonemergent basis recommended to exclude a mass. 4. No bowel obstruction or active inflammation.  Normal appendix. Electronically Signed   By: Anner Crete M.D.   On: 05/07/2019 21:31    Procedures Procedures (including critical care time)  Medications Ordered in ED Medications  sodium chloride 0.9 % bolus 1,000 mL (has no administration in time range)    ED Course  I have reviewed the triage vital signs and the nursing notes.  Pertinent labs & imaging results that were available during my care of the patient were reviewed by me and considered in my medical decision making (see chart for details).    MDM Rules/Calculators/A&P                      patient with flank pain and fever.  Has had some nausea and vomiting.  Started antibiotics for presumed infection yesterday.  However has had more nausea and vomiting.  States she is eating less so she does  not throw up.  White count elevated here and is febrile.  Has had rare cough.  CT scan done showed potentially pyelonephritis.  I think this is likely the source of the fever.  Will give IV fluids now but does not show hydronephrosis that would require a stent.  Will turn care over to oncoming  provider. Patient's i-STAT hCG mildly elevated at 60.  I think this is likely an error or does not indicate she is pregnant.  States her last menses was in 2014 and she has gone through menopause. Final Clinical Impression(s) / ED Diagnoses Final diagnoses:  Pyelonephritis    Rx / DC Orders ED Discharge Orders    None       Davonna Belling, MD 05/07/19 2149    Davonna Belling, MD 05/07/19 2150

## 2019-05-08 LAB — URINE CULTURE: Culture: <10000 — AB

## 2019-05-08 LAB — SARS CORONAVIRUS 2 (TAT 6-24 HRS): SARS Coronavirus 2: NEGATIVE

## 2019-05-12 ENCOUNTER — Telehealth: Payer: Self-pay | Admitting: Internal Medicine

## 2019-05-12 NOTE — Telephone Encounter (Signed)
Please advise 

## 2019-05-12 NOTE — Telephone Encounter (Signed)
Please add

## 2019-05-12 NOTE — Telephone Encounter (Signed)
I agree  Should  Meet criteria     Please add or update [phone]   Because should meet criteria for tobacco cessation counseling  Over 10 minutes

## 2019-05-12 NOTE — Telephone Encounter (Signed)
Pt had a visit on 01/19/2019 for smoking cessation. She uploaded flyer from her insurance on [place]. To receive credit for the visit it had to be coded with [id number] or [id number] to get the credit. She is not getting the credit, she would like Dr. Regis Bill to double check and make sure it was coded correctly.   Patient Phone: [phone]

## 2019-05-16 LAB — CULTURE, BLOOD (ROUTINE X 2)
Culture: NO GROWTH
Culture: NO GROWTH
Special Requests: ADEQUATE
Special Requests: ADEQUATE

## 2019-05-23 NOTE — Progress Notes (Signed)
Virtual Visit via Video Note  I connected with@ on 3/10 21 at 11:00 AM EST by a video enabled telemedicine application and verified that I am speaking with the correct person using two identifiers. Location patient: home Location provider:work  office Persons participating in the virtual visit: patient, provider  [place] national recommendations  regarding COVID 19 pandemic   video visit is advised over in office visit for this patient.  Patient aware  of the limitations of evaluation and management by telemedicine and  availability of in person appointments. and agreed to proceed.   HPI: Elizabeth Tanner presents for video visit on going dizziness and off balance feeling  Since got sick with stone and pyelo   Seen ed pyelo and stones  2 21  rx with cipro  Has urology appt next week for  [facility] better and no fever   But having continues  dizziness not truy vertigo but feeling of pressure in ears and ocass tinnitus  No falling . doesn not feel like vertigo as in past  Bp has been ok  No tad   For months  ROS: See pertinent positives and negatives per HPI. Knee problem hyperextended to see ortho soon   Not on a diuretic   Now no trauma  And no pain meds  And no falling  Past Medical History:  Diagnosis Date  . Depression    ? atypical  . Dysmenorrhea   . Fracture of fibula    rt  . Gluten intolerance    self reported  . History of nephrolithiasis   . History of tobacco use   . Hypertension   . Knee injury    fall on snow fracture hewitt  . Pneumonia 8 12    LLL -clear now  . Wears glasses     Past Surgical History:  Procedure Laterality Date  . BREAST REDUCTION SURGERY  2001  . MOUTH SURGERY    . ORIF TIBIA PLATEAU Right 05/05/2012   Procedure: OPEN REDUCTION INTERNAL FIXATION TIBIAL PLATEAU;  Surgeon: Wylene Simmer, MD;  Location: [facility];  Service: Orthopedics;  Laterality: Right;    Family History  Problem Relation Age of Onset  . Alcohol abuse Other    . Thyroid disease Other   . Arthritis Other        Father has a fused spine uncle with DJD  . Heart attack Maternal Uncle 30       Electrical problem also maternal aunt  . Melanoma Cousin   . Other Maternal Aunt        68 pacer.   . Cancer Mother   . Diabetes Father   . Osteoarthritis Father   . Hypertension Father     Social History   Tobacco Use  . Smoking status: Former Smoker    Packs/day: 1.50    Years: 8.00    Pack years: 12.00    Types: Cigarettes  . Smokeless tobacco: Never Used  Substance Use Topics  . Alcohol use: Yes    Alcohol/week: 0.0 standard drinks    Comment: occ  . Drug use: No      Current Outpatient Medications:  .  acetaminophen (TYLENOL) 500 MG tablet, Take 1,000 mg by mouth every 6 (six) hours as needed for moderate pain., Disp: , Rfl:  .  B Complex-C (B-COMPLEX WITH VITAMIN C) tablet, Take 1 tablet by mouth daily., Disp: , Rfl:  .  cholecalciferol (VITAMIN D3) 25 MCG (1000 UT) tablet, Take  1,000 Units by mouth daily., Disp: , Rfl:  .  ibuprofen (ADVIL) 200 MG tablet, Take 400 mg by mouth every 6 (six) hours as needed for moderate pain., Disp: , Rfl:  .  Omega-3 Fatty Acids (FISH OIL) 1000 MG CPDR, Take 1,000 mg by mouth daily., Disp: , Rfl:  .  Potassium 99 MG TABS, Take 99 mg by mouth daily., Disp: , Rfl:  .  buPROPion (WELLBUTRIN XL) 150 MG 24 hr tablet, Take 1 by mouth daily , increase to 2 by mouth  in am after 1 week (Patient not taking: Reported on 05/07/2019), Disp: 60 tablet, Rfl: 1 .  ondansetron (ZOFRAN ODT) 4 MG disintegrating tablet, Take 1 tablet (4 mg total) by mouth every 8 (eight) hours as needed for nausea or vomiting. (Patient not taking: Reported on 05/24/2019), Disp: 20 tablet, Rfl: 0 .  traMADol (ULTRAM) 50 MG tablet, Take 1 tablet (50 mg total) by mouth every 6 (six) hours as needed. (Patient not taking: Reported on 05/24/2019), Disp: 8 tablet, Rfl: 0  EXAM: BP Readings from Last 3 Encounters:  05/08/19 113/80  01/31/19 (!)  134/98  08/26/18 (!) 154/96    VITALS per patient if applicable: reported as normal   GENERAL: alert, oriented, appears well and in no acute distress  HEENT: atraumatic, conjunttiva clear, no obvious abnormalities on inspection of external nose and ears  NECK: normal movements of the head and neck  LUNGS: on inspection no signs of respiratory distress, breathing rate appears normal, no obvious gross SOB, gasping or wheezing  CV: no obvious cyanosis  MS: moves all visible extremities without noticeable abnormality  PSYCH/NEURO: pleasant and cooperative, no obvious depression or anxiety, speech and thought processing grossly intact Lab Results  Component Value Date   WBC 16.3 (H) 05/07/2019   HGB 13.6 05/07/2019   HCT 39.2 05/07/2019   PLT 170 05/07/2019   GLUCOSE 95 05/07/2019   CHOL 264 (H) 08/19/2016   TRIG 189.0 (H) 08/19/2016   HDL 45.90 08/19/2016   LDLDIRECT 178.5 12/06/2013   LDLCALC 180 (H) 08/19/2016   ALT 35 08/18/2016   AST 26 08/18/2016   NA 131 (L) 05/07/2019   K 3.3 (L) 05/07/2019   CL 93 (L) 05/07/2019   CREATININE 0.78 05/07/2019   BUN 17 05/07/2019   CO2 24 05/07/2019   TSH 2.06 08/19/2016   HGBA1C 5.5 08/19/2016    ASSESSMENT AND PLAN:  Discussed the following assessment and plan:    ICD-10-CM   1. Dizziness  [id number] Basic metabolic panel    Osmolality    Magnesium  2. Hyponatremia  123456 Basic metabolic panel    Osmolality    Magnesium  3. Hypokalemia  [id number] Basic metabolic panel    Osmolality    Magnesium  4. Balance problem  [id number] Basic metabolic panel    Osmolality    Magnesium  5. History of renal stone  Z87.442   6. History of pyelonephritis  Z87.448    sodium of 131  cipro  rx on 2 20  For 7 days  And illness   Fu hyponatremia  No diuretic  begain when got sick and persitsted  Off  cipro for 2 weeks   Has some ear sx with t his but not true vertigo  ( ocass tinnitus and ear pressure)  balbnce also may be effected by knee  predicament and illnss meds and other  Counseled.  Fu labs and if ok then plan on  ENT evaluation ? Vestibular ?  Med effect? R/o Meniere  Get hearing eval   Expectant management and discussion of plan and treatment with opportunity to ask questions and all were answered. The patient agreed with the plan and demonstrated an understanding of the instructions.   Advised to call back or seek an in-person evaluation if worsening  or having  further concerns . In interim  Return for depending on results an how doing . Marland Kitchen Shanon Ace, MD

## 2019-05-24 ENCOUNTER — Encounter: Payer: Self-pay | Admitting: Internal Medicine

## 2019-05-24 ENCOUNTER — Other Ambulatory Visit: Payer: Self-pay

## 2019-05-24 ENCOUNTER — Telehealth (INDEPENDENT_AMBULATORY_CARE_PROVIDER_SITE_OTHER): Payer: BC Managed Care – PPO | Admitting: Internal Medicine

## 2019-05-24 DIAGNOSIS — Z87442 Personal history of urinary calculi: Secondary | ICD-10-CM

## 2019-05-24 DIAGNOSIS — R2689 Other abnormalities of gait and mobility: Secondary | ICD-10-CM

## 2019-05-24 DIAGNOSIS — R42 Dizziness and giddiness: Secondary | ICD-10-CM | POA: Diagnosis not present

## 2019-05-24 DIAGNOSIS — E876 Hypokalemia: Secondary | ICD-10-CM

## 2019-05-24 DIAGNOSIS — Z87448 Personal history of other diseases of urinary system: Secondary | ICD-10-CM

## 2019-05-24 DIAGNOSIS — E871 Hypo-osmolality and hyponatremia: Secondary | ICD-10-CM | POA: Diagnosis not present

## 2019-05-30 ENCOUNTER — Other Ambulatory Visit: Payer: Self-pay | Admitting: Urology

## 2019-05-30 DIAGNOSIS — D4102 Neoplasm of uncertain behavior of left kidney: Secondary | ICD-10-CM

## 2019-06-01 ENCOUNTER — Ambulatory Visit (INDEPENDENT_AMBULATORY_CARE_PROVIDER_SITE_OTHER): Payer: BC Managed Care – PPO | Admitting: Otolaryngology

## 2019-06-01 ENCOUNTER — Encounter (INDEPENDENT_AMBULATORY_CARE_PROVIDER_SITE_OTHER): Payer: Self-pay | Admitting: Otolaryngology

## 2019-06-01 ENCOUNTER — Other Ambulatory Visit: Payer: Self-pay

## 2019-06-01 VITALS — Temp 98.1°F

## 2019-06-01 DIAGNOSIS — R42 Dizziness and giddiness: Secondary | ICD-10-CM

## 2019-06-01 DIAGNOSIS — H938X3 Other specified disorders of ear, bilateral: Secondary | ICD-10-CM | POA: Diagnosis not present

## 2019-06-01 NOTE — Progress Notes (Signed)
HPI: Elizabeth Tanner is a 49 y.o. female who presents is referred by Dr. Regis Bill for evaluation of chronic dizziness as well as pressure in her ears.  She has occasional ringing in the ears. The problems with her dizziness initially began last June where she was in bed and accidentally dropped her head off the bed suddenly having onset of dizziness that lasted for over a week.  At one point she thought she was going to pass out.  She has been undergoing physical therapy for the chronic dizziness.  Initially she was describing vertigo for several days.  She really did not notice initial change in her hearing.  The dizziness seems to come and go at times. .  Past Medical History:  Diagnosis Date  . Depression    ? atypical  . Dysmenorrhea   . Fracture of fibula    rt  . Gluten intolerance    self reported  . History of nephrolithiasis   . History of tobacco use   . Hypertension   . Knee injury    fall on snow fracture hewitt  . Pneumonia 8 12    LLL -clear now  . Wears glasses    Past Surgical History:  Procedure Laterality Date  . BREAST REDUCTION SURGERY  2001  . MOUTH SURGERY    . ORIF TIBIA PLATEAU Right 05/05/2012   Procedure: OPEN REDUCTION INTERNAL FIXATION TIBIAL PLATEAU;  Surgeon: Wylene Simmer, MD;  Location: [facility];  Service: Orthopedics;  Laterality: Right;   Social History   Socioeconomic History  . Marital status: Single    Spouse name: Not on file  . Number of children: Not on file  . Years of education: Not on file  . Highest education level: Not on file  Occupational History  . Not on file  Tobacco Use  . Smoking status: Former Smoker    Packs/day: 0.25    Years: 31.00    Pack years: 7.75    Types: Cigarettes    Start date: 1989    Quit date: 2020    Years since quitting: 1.2  . Smokeless tobacco: Never Used  . Tobacco comment: Pt smoked off and on over the years  Substance and Sexual Activity  . Alcohol use: Yes    Alcohol/week: 0.0  standard drinks    Comment: occ  . Drug use: No  . Sexual activity: Not on file    Comment: is smoking a few cigs  Other Topics Concern  . 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               Social Determinants of Health   Financial Resource Strain:   . Difficulty of Paying Living Expenses:   Food Insecurity:   . Worried About Charity fundraiser in the Last Year:   . Arboriculturist in the Last Year:   Transportation Needs:   . Film/video editor (Medical):   Marland Kitchen Lack of Transportation (Non-Medical):   Physical Activity:   . Days of Exercise per Week:   . Minutes of Exercise per Session:   Stress:   . Feeling of Stress :   Social Connections:   . Frequency of Communication with Friends and Family:   .  Frequency of Social Gatherings with Friends and Family:   . Attends Religious Services:   . Active Member of Clubs or Organizations:   . Attends Archivist Meetings:   Marland Kitchen Marital Status:    Family History  Problem Relation Age of Onset  . Alcohol abuse Other   . Thyroid disease Other   . Arthritis Other        Father has a fused spine uncle with DJD  . Heart attack Maternal Uncle 30       Electrical problem also maternal aunt  . Melanoma Cousin   . Other Maternal Aunt        68 pacer.   . Cancer Mother   . Diabetes Father   . Osteoarthritis Father   . Hypertension Father    Allergies  Allergen Reactions  . Azithromycin Itching    Reports similar to erythromycin  . Amoxicillin Itching  . Erythromycin Itching  . Gluten   . Vicodin [Hydrocodone-Acetaminophen] Nausea Only  . Penicillins Rash    Did it involve swelling of the face/tongue/throat, SOB, or low BP? No Did it involve sudden or severe rash/hives, skin peeling, or any reaction on the inside of your mouth or nose? No Did  you need to seek medical attention at a hospital or doctor's office? No When did it last happen? If all above answers are "NO", may proceed with cephalosporin use.    Prior to Admission medications   Medication Sig Start Date End Date Taking? Authorizing Provider  acetaminophen (TYLENOL) 500 MG tablet Take 1,000 mg by mouth every 6 (six) hours as needed for moderate pain.   Yes [provider]  B Complex-C (B-COMPLEX WITH VITAMIN C) tablet Take 1 tablet by mouth daily.   Yes [provider]  buPROPion (WELLBUTRIN XL) 150 MG 24 hr tablet Take 1 by mouth daily , increase to 2 by mouth  in am after 1 week 01/19/19  Yes Panosh, Standley Brooking, MD  cholecalciferol (VITAMIN D3) 25 MCG (1000 UT) tablet Take 1,000 Units by mouth daily.   Yes [provider]  ibuprofen (ADVIL) 200 MG tablet Take 400 mg by mouth every 6 (six) hours as needed for moderate pain.   Yes [provider]  Omega-3 Fatty Acids (FISH OIL) 1000 MG CPDR Take 1,000 mg by mouth daily.   Yes [provider]  ondansetron (ZOFRAN ODT) 4 MG disintegrating tablet Take 1 tablet (4 mg total) by mouth every 8 (eight) hours as needed for nausea or vomiting. 05/07/19  Yes Upstill, Shari, PA-C  Potassium 99 MG TABS Take 99 mg by mouth daily.   Yes [provider]  traMADol (ULTRAM) 50 MG tablet Take 1 tablet (50 mg total) by mouth every 6 (six) hours as needed. 05/07/19  Yes Upstill, Nehemiah Settle, PA-C     Positive ROS: Otherwise negative  All other systems have been reviewed and were otherwise negative with the exception of those mentioned in the HPI and as above.  Physical Exam: Constitutional: Alert, well-appearing, no acute distress Ears: External ears without lesions or tenderness. Ear canals are clear bilaterally.  Both TMs are clear with good mobility on pneumatic otoscopy.  On tuning fork testing she had perhaps a mild hearing loss in both ears but had symmetric hearing with AC > BC  bilaterally.  Dix-Hallpike testing revealed no evidence of BPPV. Nasal: External nose without lesions. Clear nasal passages bilaterally with no signs of infection. Oral: Lips and gums without lesions. Tongue and  palate mucosa without lesions. Posterior oropharynx clear. Neck: No palpable adenopathy or masses Respiratory: Breathing comfortably  Skin: No facial/neck lesions or rash noted.  Procedures  Assessment: Normal ear evaluation with no signs of infection. Dizziness questionable etiology although I suspect this is probably more central in nature than peripheral or vestibular.  Plan: We will plan on scheduling her for VNG testing and audiologic testing to rule out inner ear abnormality. She might benefit by seeing neurology. She will follow-up following the VNG testing audiologic testing.   [name], MD   CC:

## 2019-06-02 NOTE — Telephone Encounter (Signed)
Please advise 

## 2019-06-02 NOTE — Telephone Encounter (Signed)
So dr Jennell Corner note said to do the tesing and follow up  Assessment: Normal ear evaluation with no signs of infection. Dizziness questionable etiology although I suspect this is probably more central in nature than peripheral or vestibular.  Plan: We will plan on scheduling her for VNG testing and audiologic testing to rule out inner ear abnormality. She might benefit by seeing neurology. She will follow-up following the VNG testing audiologic testing.   Elizabeth Journey, MD  Please do this follow through first and  If needed we can then do a referral

## 2019-06-06 NOTE — Telephone Encounter (Signed)
Done

## 2019-06-22 ENCOUNTER — Telehealth (INDEPENDENT_AMBULATORY_CARE_PROVIDER_SITE_OTHER): Payer: Self-pay | Admitting: Otolaryngology

## 2019-06-22 NOTE — Telephone Encounter (Signed)
Returned a call to Elizabeth Tanner concerning results of her VNG and audiologic testing. This demonstrated normal hearing in both ears with type A tympanograms bilaterally.  VNG testing revealed negative Dix-Hallpike test.  And otherwise normal vestibular testing and normal VNG. Reviewed with patient that this demonstrated no inner ear abnormality and would recommend further follow-up with neurology concerning her chronic dizziness.

## 2019-06-26 ENCOUNTER — Other Ambulatory Visit: Payer: Self-pay

## 2019-06-26 ENCOUNTER — Ambulatory Visit
Admission: RE | Admit: 2019-06-26 | Discharge: 2019-06-26 | Disposition: A | Discharge status: Home / Self Care | Payer: BC Managed Care – PPO | Source: Ambulatory Visit | Attending: Urology | Admitting: Urology

## 2019-06-26 DIAGNOSIS — D4102 Neoplasm of uncertain behavior of left kidney: Secondary | ICD-10-CM

## 2019-06-26 MED ORDER — GADOBENATE DIMEGLUMINE 529 MG/ML IV SOLN
14.0000 mL | Freq: Once | INTRAVENOUS | Status: AC | PRN
  Administered 2019-06-26: 14 mL via INTRAVENOUS

## 2019-06-29 ENCOUNTER — Encounter (INDEPENDENT_AMBULATORY_CARE_PROVIDER_SITE_OTHER): Payer: Self-pay

## 2019-07-24 ENCOUNTER — Ambulatory Visit: Payer: BC Managed Care – PPO | Admitting: Diagnostic Neuroimaging

## 2019-07-24 ENCOUNTER — Encounter: Payer: Self-pay | Admitting: Diagnostic Neuroimaging

## 2019-07-24 ENCOUNTER — Other Ambulatory Visit: Payer: Self-pay

## 2019-07-24 VITALS — BP 136/91 | HR 85 | Temp 96.8°F | Ht 59.0 in | Wt 167.2 lb

## 2019-07-24 DIAGNOSIS — F419 Anxiety disorder, unspecified: Secondary | ICD-10-CM | POA: Diagnosis not present

## 2019-07-24 DIAGNOSIS — R42 Dizziness and giddiness: Secondary | ICD-10-CM

## 2019-07-24 DIAGNOSIS — G43009 Migraine without aura, not intractable, without status migrainosus: Secondary | ICD-10-CM

## 2019-07-24 DIAGNOSIS — G43809 Other migraine, not intractable, without status migrainosus: Secondary | ICD-10-CM

## 2019-07-24 DIAGNOSIS — G4700 Insomnia, unspecified: Secondary | ICD-10-CM | POA: Diagnosis not present

## 2019-07-24 MED ORDER — AMITRIPTYLINE HCL 25 MG PO TABS: 25.0000 mg | ORAL_TABLET | Freq: Every day | ORAL | 6 refills | Status: AC

## 2019-07-24 NOTE — Progress Notes (Signed)
[facility]  PATIENT: Elizabeth Tanner DOB: 09/13/1970  REFERRING CLINICIAN: Panosh, Standley Brooking, MD HISTORY FROM: patient  REASON FOR VISIT: new consult    HISTORICAL  CHIEF COMPLAINT:  Chief Complaint  Patient presents with  . Dizziness    rm 6 New Pt  "head injury w/wrenched neck June 2020, developed vertigo- did vestibular therapy with improvement but it has returned; saw ENT in March"  . Vertigo    HISTORY OF PRESENT ILLNESS:   49 year old female here for evaluation of dizziness and lightheadedness.  June 2020 patient was in bed, rolled over and had slipped off the pillow resulting in rapid neck extension.  Her eyes rolled back and she felt lightheaded like she was going to pass out.  She had intense vertigo sensation with nausea.  Symptoms lasted for a few days and she was not able to leave home.  She has had similar vertigo attacks, milder in the past.  Within a few days she was able to go to urgent care, prescribed medication such as steroids muscle relaxers and meclizine.  Symptoms improved.  July 2020 patient had a driving highway but this triggered vertigo nausea tach.  September 2020 patient went to chiropractor to get some adjustments, was told she had some arthritis issues but did not seek further treatments.  October 2020 she went to [facility] neuro rehab vestibular therapy.  She had some treatment sessions but this seemed to aggravate her symptoms.  Patient also saw ENT and was ruled out for any peripheral vestibular processes.  Starting in 2021 she had issues of lightheadedness, dizziness, difficulty driving, difficulty going to the store.  Symptoms seem to be aggravated in certain situations such as driving on the highway or going to large stores that are busy.  Patient has chronic insomnia and anxiety.  She averages 4 hours of sleep.  If she takes Benadryl she might get 6 hours of sleep.  She has seen psychiatrist in the past has tried Lamictal other  medications in the past but not currently.  Symptoms seem to be aggravated with weather changes and stress.  Patient has history of migraine headaches in the past with pressure sensation, photophobia, nausea and sinus pressure.  Patient has right knee injury and pain, using a cane.  Patient working from home currently.  She is planned to go back to the office [employer]) in July 2021.    REVIEW OF SYSTEMS: Full 14 system review of systems performed and negative with exception of: as per HPI.  ALLERGIES: Allergies  Allergen Reactions  . Azithromycin Itching    Reports similar to erythromycin  . Amoxicillin Itching  . Erythromycin Itching  . Gluten   . Vicodin [Hydrocodone-Acetaminophen] Nausea Only  . Penicillins Rash    Did it involve swelling of the face/tongue/throat, SOB, or low BP? No Did it involve sudden or severe rash/hives, skin peeling, or any reaction on the inside of your mouth or nose? No Did you need to seek medical attention at a hospital or doctor's office? No When did it last happen? If all above answers are "NO", may proceed with cephalosporin use.     HOME MEDICATIONS: Outpatient Medications Prior to Visit  Medication Sig Dispense Refill  . Ascorbic Acid (VITAMIN C) 100 MG tablet Take 100 mg by mouth daily.    . B Complex-C (B-COMPLEX WITH VITAMIN C) tablet Take 1 tablet by mouth daily.    . cholecalciferol (VITAMIN D3) 25 MCG (1000 UT) tablet Take 1,000 Units  by mouth daily.    . diphenhydrAMINE (BENADRYL) 25 MG tablet Take 25 mg by mouth at bedtime as needed.    Marland Kitchen ibuprofen (ADVIL) 200 MG tablet Take 400 mg by mouth every 6 (six) hours as needed for moderate pain.    Marland Kitchen loratadine (CLARITIN) 10 MG tablet Take 10 mg by mouth daily as needed for allergies.    . Omega-3 Fatty Acids (FISH OIL) 1000 MG CPDR Take 1,000 mg by mouth daily.    . Potassium 99 MG TABS Take 99 mg by mouth daily.    . Turmeric (QC TUMERIC COMPLEX PO) Take by mouth.    Marland Kitchen  acetaminophen (TYLENOL) 500 MG tablet Take 1,000 mg by mouth every 6 (six) hours as needed for moderate pain.    Marland Kitchen buPROPion (WELLBUTRIN XL) 150 MG 24 hr tablet Take 1 by mouth daily , increase to 2 by mouth  in am after 1 week (Patient not taking: Reported on 07/24/2019) 60 tablet 1  . ondansetron (ZOFRAN ODT) 4 MG disintegrating tablet Take 1 tablet (4 mg total) by mouth every 8 (eight) hours as needed for nausea or vomiting. (Patient not taking: Reported on 07/24/2019) 20 tablet 0  . traMADol (ULTRAM) 50 MG tablet Take 1 tablet (50 mg total) by mouth every 6 (six) hours as needed. (Patient not taking: Reported on 07/24/2019) 8 tablet 0   No facility-administered medications prior to visit.    PAST MEDICAL HISTORY: Past Medical History:  Diagnosis Date  . Depression    ? atypical  . Dysmenorrhea   . Fracture of fibula    rt  . Gluten intolerance    self reported  . History of nephrolithiasis 04/2019  . History of tobacco use   . Hypertension   . Knee injury    fall on snow fracture [place]  . Pneumonia 8 12    LLL -clear now  . Wears glasses     PAST SURGICAL HISTORY: Past Surgical History:  Procedure Laterality Date  . BREAST REDUCTION SURGERY  2001  . MOUTH SURGERY    . ORIF TIBIA PLATEAU Right 05/05/2012   Procedure: OPEN REDUCTION INTERNAL FIXATION TIBIAL PLATEAU;  Surgeon: Wylene Simmer, MD;  Location: [facility];  Service: Orthopedics;  Laterality: Right;    FAMILY HISTORY: Family History  Problem Relation Age of Onset  . Alcohol abuse Other   . Thyroid disease Other   . Arthritis Other        Father has a fused spine uncle with DJD  . Heart attack Maternal Uncle 30       Electrical problem also maternal aunt  . Melanoma Cousin   . Other Maternal Aunt        68 pacer.   . Cancer Mother   . Diabetes Father   . Osteoarthritis Father   . Hypertension Father   . Other Sister        autoimmune issues    SOCIAL HISTORY: Social History    Socioeconomic History  . Marital status: Single    Spouse name: Not on file  . Number of children: 0  . Years of education: Not on file  . Highest education level: Bachelor's degree (e.g., BA, AB, BS)  Occupational History  . Not on file  Tobacco Use  . Smoking status: Former Smoker    Packs/day: 0.25    Years: 31.00    Pack years: 7.75    Types: Cigarettes    Start date: 1989  Quit date: 2020    Years since quitting: 1.3  . Smokeless tobacco: Never Used  . Tobacco comment: 07/24/19 recently quit again; Pt smoked off and on over the years  Substance and Sexual Activity  . Alcohol use: Not Currently    Alcohol/week: 0.0 standard drinks  . Drug use: No  . Sexual activity: Not on file    Comment: is smoking a few cigs  Other Topics Concern  . 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 07/24/19 quit in Jan   Caffeine- 4 c coffee daily            Social Determinants of Health   Financial Resource Strain:   . Difficulty of Paying Living Expenses:   Food Insecurity:   . Worried About Charity fundraiser in the Last Year:   . Arboriculturist in the Last Year:   Transportation Needs:   . Film/video editor (Medical):   Marland Kitchen Lack of Transportation (Non-Medical):   Physical Activity:   . Days of Exercise per Week:   . Minutes of Exercise per Session:   Stress:   . Feeling of Stress :   Social Connections:   . Frequency of Communication with Friends and Family:   . Frequency of Social Gatherings with Friends and Family:   . Attends Religious Services:   . Active Member of Clubs or Organizations:   . Attends Archivist Meetings:   Marland Kitchen Marital Status:   Intimate Partner Violence:   . Fear of Current or Ex-Partner:   . Emotionally Abused:   Marland Kitchen Physically Abused:   . Sexually Abused:       PHYSICAL EXAM  GENERAL EXAM/CONSTITUTIONAL: Vitals:  Vitals:   07/24/19 0944  BP: (!) 136/91  Pulse: 85  Temp: (!) 96.8 F (36 C)  Weight: 167 lb 3.2 oz (75.8 kg)  Height: 4\' 11"  (1.499 m)     Body mass index is 33.77 kg/m. Wt Readings from Last 3 Encounters:  07/24/19 167 lb 3.2 oz (75.8 kg)  02/12/17 178 lb 6.4 oz (80.9 kg)  11/11/16 180 lb 12.8 oz (82 kg)     Patient is in no distress; well developed, nourished and groomed; neck is supple  CARDIOVASCULAR:  Examination of carotid arteries is normal; no carotid bruits  Regular rate and rhythm, no murmurs  Examination of peripheral vascular system by observation and palpation is normal  EYES:  Ophthalmoscopic exam of optic discs and posterior segments is normal; no papilledema or hemorrhages  No exam data present  MUSCULOSKELETAL:  Gait, strength, tone, movements noted in Neurologic exam below  NEUROLOGIC: MENTAL STATUS:  No flowsheet data found.  awake, alert, oriented to person, place and time  recent and remote memory intact  normal attention and concentration  language fluent, comprehension intact, naming intact  fund of knowledge appropriate  CRANIAL NERVE:   2nd - no papilledema on fundoscopic exam  2nd, 3rd, 4th, 6th - pupils equal and reactive to light, visual fields full to confrontation, extraocular muscles intact, no nystagmus  5th - facial sensation symmetric  7th - facial strength symmetric  8th - hearing intact  9th - palate elevates symmetrically, uvula midline  11th - shoulder shrug symmetric  12th - tongue protrusion  midline  MOTOR:   normal bulk and tone, full strength in the BUE, BLE  SENSORY:   normal and symmetric to light touch, temperature, vibration  COORDINATION:   finger-nose-finger, fine finger movements normal  REFLEXES:   deep tendon reflexes present and symmetric  GAIT/STATION:   narrow based gait; USING CANE     DIAGNOSTIC DATA  (LABS, IMAGING, TESTING) - I reviewed patient records, labs, notes, testing and imaging myself where available.  Lab Results  Component Value Date   WBC 16.3 (H) 05/07/2019   HGB 13.6 05/07/2019   HCT 39.2 05/07/2019   MCV 88.7 05/07/2019   PLT 170 05/07/2019      Component Value Date/Time   NA 131 (L) 05/07/2019 2035   K 3.3 (L) 05/07/2019 2035   CL 93 (L) 05/07/2019 2035   CO2 24 05/07/2019 2035   GLUCOSE 95 05/07/2019 2035   BUN 17 05/07/2019 2035   CREATININE 0.78 05/07/2019 2035   CALCIUM 9.5 05/07/2019 2035   PROT 7.6 08/18/2016 1621   ALBUMIN 4.7 08/18/2016 1621   AST 26 08/18/2016 1621   ALT 35 08/18/2016 1621   ALKPHOS 64 08/18/2016 1621   BILITOT 0.8 08/18/2016 1621   GFRNONAA >60 05/07/2019 2035   GFRAA >60 05/07/2019 2035   Lab Results  Component Value Date   CHOL 264 (H) 08/19/2016   HDL 45.90 08/19/2016   LDLCALC 180 (H) 08/19/2016   LDLDIRECT 178.5 12/06/2013   TRIG 189.0 (H) 08/19/2016   CHOLHDL 6 08/19/2016   Lab Results  Component Value Date   HGBA1C 5.5 08/19/2016   Lab Results  Component Value Date   VITAMINB12 359 12/23/2010   Lab Results  Component Value Date   TSH 2.06 08/19/2016       ASSESSMENT AND PLAN  49 y.o. year old female here with dizziness, lightheadedness, insomnia, anxiety, history of migraine.  Patient may have had a benign positional vertigo attack in June 2020 which has improved.  Now feeling symptoms more related to intermittent migraine and anxiety resulting in nonspecific lightheadedness, dizziness, feeling overwhelmed with certain situations such as driving and going to large stores.  Reassured patient with normal neurologic examination today.  Will check MRI of the brain to rule out any other secondary causes.  Symptoms should improve over time.  We will try low-dose amitriptyline to help with migraine and anxiety.  Dx:  1. Migraine without aura and without status migrainosus, not intractable   2. Vestibular  migraine   3. Anxiety   4. Insomnia, unspecified type      PLAN:  DIZZINESS / LIGHTHEADEDNESS (with migraine and anxiety features) - start amitriptyline 25mg  at bedtime - reviewed sleep, anxiety, exercise, desensitization treatments  Orders Placed This Encounter  Procedures  . MR BRAIN W WO CONTRAST    Meds ordered this encounter  Medications  . amitriptyline (ELAVIL) 25 MG tablet    Sig: Take 1 tablet (25 mg total) by mouth at bedtime.    Dispense:  30 tablet    Refill:  6    Return in about 6 months (around 01/24/2020).    Penni Bombard, MD [id number], [id number] AM Certified in Neurology, Neurophysiology and Neuroimaging  [facility] [address], [place], [place] [address] [phone]

## 2019-07-24 NOTE — Patient Instructions (Signed)
DIZZINESS / LIGHTHEADEDNESS (with migraine and anxiety features) - check MRI brain - start amitriptyline 25mg  at bedtime - reviewed sleep, anxiety, exercise, desensitization treatments

## 2019-07-31 ENCOUNTER — Telehealth: Payer: Self-pay | Admitting: Diagnostic Neuroimaging

## 2019-07-31 NOTE — Telephone Encounter (Signed)
no to the covid questions MR Brain w/wo contrast Dr. Conley Simmonds Auth: [id number] (exp. 07/28/19 to 01/23/20). Patient is scheduled at [facility] for 08/08/19.

## 2019-08-08 ENCOUNTER — Other Ambulatory Visit: Payer: Self-pay

## 2019-08-08 ENCOUNTER — Ambulatory Visit: Payer: BC Managed Care – PPO

## 2019-08-08 DIAGNOSIS — R42 Dizziness and giddiness: Secondary | ICD-10-CM | POA: Diagnosis not present

## 2019-08-08 MED ORDER — GADOBENATE DIMEGLUMINE 529 MG/ML IV SOLN
15.0000 mL | Freq: Once | INTRAVENOUS | Status: AC | PRN
  Administered 2019-08-08: 15 mL via INTRAVENOUS

## 2019-08-10 ENCOUNTER — Telehealth: Payer: Self-pay | Admitting: *Deleted

## 2019-08-10 NOTE — Telephone Encounter (Signed)
LVM informing patient her MRI brain was normal. Advised she continue taking amitriptyline as prescribed, call next week for any questions.

## 2019-09-01 ENCOUNTER — Other Ambulatory Visit: Payer: Self-pay | Admitting: Physician Assistant

## 2019-09-01 ENCOUNTER — Other Ambulatory Visit (HOSPITAL_COMMUNITY)
Admission: RE | Admit: 2019-09-01 | Discharge: 2019-09-01 | Disposition: A | Discharge status: Home / Self Care | Payer: BC Managed Care – PPO | Source: Ambulatory Visit | Attending: Physician Assistant | Admitting: Physician Assistant

## 2019-09-01 DIAGNOSIS — Z0000 Encounter for general adult medical examination without abnormal findings: Secondary | ICD-10-CM | POA: Diagnosis present

## 2019-09-04 LAB — CYTOLOGY - PAP
Comment: NEGATIVE
Diagnosis: NEGATIVE
High risk HPV: NEGATIVE

## 2020-01-24 ENCOUNTER — Encounter: Payer: Self-pay | Admitting: Diagnostic Neuroimaging

## 2020-01-24 ENCOUNTER — Ambulatory Visit: Payer: BC Managed Care – PPO | Admitting: Diagnostic Neuroimaging

## 2020-01-24 VITALS — BP 166/118 | HR 114 | Ht 59.0 in

## 2020-01-24 DIAGNOSIS — R42 Dizziness and giddiness: Secondary | ICD-10-CM | POA: Diagnosis not present

## 2020-01-24 DIAGNOSIS — F419 Anxiety disorder, unspecified: Secondary | ICD-10-CM | POA: Diagnosis not present

## 2020-01-24 DIAGNOSIS — G4700 Insomnia, unspecified: Secondary | ICD-10-CM | POA: Diagnosis not present

## 2020-01-24 NOTE — Progress Notes (Signed)
[facility]  PATIENT: Elizabeth Tanner DOB: 1970/11/16  REFERRING CLINICIAN: Panosh, Standley Brooking, MD HISTORY FROM: patient  REASON FOR VISIT: follow up   HISTORICAL  CHIEF COMPLAINT:  Chief Complaint  Patient presents with  . Migraine    rm 6, 6 month FU "stopped eating dairy, raw onions and headaches reduced in #, they are now weather related"     HISTORY OF PRESENT ILLNESS:   UPDATE (01/24/20, VRP): Since last visit, doing about the same. Amitriptyline helped with sleep in the beginning, but now not that effective. Not much HA. Dizziness and anxiety continue. Still diff with insomnia.   PRIOR HPI (08/10/19): 49 year old female here for evaluation of dizziness and lightheadedness.  June 2020 patient was in bed, rolled over and had slipped off the pillow resulting in rapid neck extension.  Her eyes rolled back and she felt lightheaded like she was going to pass out.  She had intense vertigo sensation with nausea.  Symptoms lasted for a few days and she was not able to leave home.  She has had similar vertigo attacks, milder in the past.  Within a few days she was able to go to urgent care, prescribed medication such as steroids muscle relaxers and meclizine.  Symptoms improved.  July 2020 patient was driving on highway but this triggered vertigo nausea tachycardia.  September 2020 patient went to chiropractor to get some adjustments, was told she had some arthritis issues but did not seek further treatments.  October 2020 she went to [facility]hab vestibular therapy.  She had some treatment sessions but this seemed to aggravate her symptoms.  Patient also saw ENT and was ruled out for any peripheral vestibular processes.  Starting in 2021 she had issues of lightheadedness, dizziness, difficulty driving, difficulty going to the store.  Symptoms seem to be aggravated in certain situations such as driving on the highway or going to large stores that are  busy.  Patient has chronic insomnia and anxiety.  She averages 4 hours of sleep.  If she takes Benadryl she might get 6 hours of sleep.  She has seen psychiatrist in the past has tried Lamictal other medications in the past but not currently.  Symptoms seem to be aggravated with weather changes and stress.  Patient has history of migraine headaches in the past with pressure sensation, photophobia, nausea and sinus pressure.  Patient has right knee injury and pain, using a cane.  Patient working from home currently.  She is planned to go back to the office [employer]) in July 2021.    REVIEW OF SYSTEMS: Full 14 system review of systems performed and negative with exception of: as per HPI.  ALLERGIES: Allergies  Allergen Reactions  . Azithromycin Itching    Reports similar to erythromycin  . Amoxicillin Itching  . Erythromycin Itching  . Gluten   . Vicodin [Hydrocodone-Acetaminophen] Nausea Only  . Penicillins Rash    Did it involve swelling of the face/tongue/throat, SOB, or low BP? No Did it involve sudden or severe rash/hives, skin peeling, or any reaction on the inside of your mouth or nose? No Did you need to seek medical attention at a hospital or doctor's office? No When did it last happen? If all above answers are "NO", may proceed with cephalosporin use.     HOME MEDICATIONS: Outpatient Medications Prior to Visit  Medication Sig Dispense Refill  . amitriptyline (ELAVIL) 25 MG tablet Take 1 tablet (25 mg total) by mouth at bedtime. [place]  tablet 6  . Ascorbic Acid (VITAMIN C) 100 MG tablet Take 100 mg by mouth daily.    . B Complex-C (B-COMPLEX WITH VITAMIN C) tablet Take 1 tablet by mouth daily.    . cholecalciferol (VITAMIN D3) 25 MCG (1000 UT) tablet Take 1,000 Units by mouth daily.    Marland Kitchen ibuprofen (ADVIL) 200 MG tablet Take 400 mg by mouth every 6 (six) hours as needed for moderate pain.    Marland Kitchen loratadine (CLARITIN) 10 MG tablet Take 10 mg by mouth daily as needed for  allergies.    . Omega-3 Fatty Acids (FISH OIL) 1000 MG CPDR Take 1,000 mg by mouth daily.    . Potassium 99 MG TABS Take 99 mg by mouth daily.    . Turmeric (QC TUMERIC COMPLEX PO) Take by mouth.    Marland Kitchen acetaminophen (TYLENOL) 500 MG tablet Take 1,000 mg by mouth every 6 (six) hours as needed for moderate pain. (Patient not taking: Reported on 01/24/2020)    . diphenhydrAMINE (BENADRYL) 25 MG tablet Take 25 mg by mouth at bedtime as needed. (Patient not taking: Reported on 01/24/2020)     No facility-administered medications prior to visit.    PAST MEDICAL HISTORY: Past Medical History:  Diagnosis Date  . Depression    ? atypical  . Dysmenorrhea   . Fracture of fibula    rt  . Gluten intolerance    self reported  . History of nephrolithiasis 04/2019  . History of tobacco use   . Hypertension   . Knee injury    fall on snow fracture [place]  . Pneumonia 8 12    LLL -clear now  . Wears glasses     PAST SURGICAL HISTORY: Past Surgical History:  Procedure Laterality Date  . BREAST REDUCTION SURGERY  2001  . MOUTH SURGERY    . ORIF TIBIA PLATEAU Right 05/05/2012   Procedure: OPEN REDUCTION INTERNAL FIXATION TIBIAL PLATEAU;  Surgeon: Wylene Simmer, MD;  Location: [facility];  Service: Orthopedics;  Laterality: Right;    FAMILY HISTORY: Family History  Problem Relation Age of Onset  . Alcohol abuse Other   . Thyroid disease Other   . Arthritis Other        Father has a fused spine uncle with DJD  . Heart attack Maternal Uncle 30       Electrical problem also maternal aunt  . Melanoma Cousin   . Other Maternal Aunt        68 pacer.   . Cancer Mother   . Diabetes Father   . Osteoarthritis Father   . Hypertension Father   . Other Sister        autoimmune issues    SOCIAL HISTORY: Social History   Socioeconomic History  . Marital status: Single    Spouse name: Not on file  . Number of children: 0  . Years of education: Not on file  . Highest education  level: Bachelor's degree (e.g., BA, AB, BS)  Occupational History  . Not on file  Tobacco Use  . Smoking status: Former Smoker    Packs/day: 0.25    Years: 31.00    Pack years: 7.75    Types: Cigarettes    Start date: 1989    Quit date: 2020    Years since quitting: 1.8  . Smokeless tobacco: Never Used  . Tobacco comment: 07/24/19 recently quit again; Pt smoked off and on over the years  Vaping Use  . Vaping Use: Never  used  Substance and Sexual Activity  . Alcohol use: Not Currently    Alcohol/week: 0.0 standard drinks  . Drug use: No  . Sexual activity: Not on file    Comment: is smoking a few cigs  Other Topics Concern  . 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 07/24/19 quit in Jan   Caffeine- 4 c coffee daily            Social Determinants of Health   Financial Resource Strain:   . Difficulty of Paying Living Expenses: Not on file  Food Insecurity:   . Worried About Charity fundraiser in the Last Year: Not on file  . Ran Out of Food in the Last Year: Not on file  Transportation Needs:   . Lack of Transportation (Medical): Not on file  . Lack of Transportation (Non-Medical): Not on file  Physical Activity:   . Days of Exercise per Week: Not on file  . Minutes of Exercise per Session: Not on file  Stress:   . Feeling of Stress : Not on file  Social Connections:   . Frequency of Communication with Friends and Family: Not on file  . Frequency of Social Gatherings with Friends and Family: Not on file  . Attends Religious Services: Not on file  . Active Member of Clubs or Organizations: Not on file  . Attends Archivist Meetings: Not on file  . Marital Status: Not on file  Intimate Partner Violence:   . Fear of Current or Ex-Partner: Not on file  . Emotionally Abused: Not on file  .  Physically Abused: Not on file  . Sexually Abused: Not on file     PHYSICAL EXAM  GENERAL EXAM/CONSTITUTIONAL: Vitals:  Vitals:   01/24/20 1410  BP: (!) 166/118  Pulse: (!) 114  Height: 4\' 11"  (1.499 m)   Body mass index is 33.77 kg/m. Wt Readings from Last 3 Encounters:  07/24/19 167 lb 3.2 oz (75.8 kg)  02/12/17 178 lb 6.4 oz (80.9 kg)  11/11/16 180 lb 12.8 oz (82 kg)    Patient is in no distress; well developed, nourished and groomed; neck is supple  CARDIOVASCULAR:  Examination of carotid arteries is normal; no carotid bruits  Regular rate and rhythm, no murmurs  Examination of peripheral vascular system by observation and palpation is normal  EYES:  Ophthalmoscopic exam of optic discs and posterior segments is normal; no papilledema or hemorrhages No exam data present  MUSCULOSKELETAL:  Gait, strength, tone, movements noted in Neurologic exam below  NEUROLOGIC: MENTAL STATUS:  No flowsheet data found.  awake, alert, oriented to person, place and time  recent and remote memory intact  normal attention and concentration  language fluent, comprehension intact, naming intact  fund of knowledge appropriate  CRANIAL NERVE:   2nd - no papilledema on fundoscopic exam  2nd, 3rd, 4th, 6th - pupils equal and reactive to light, visual fields full to confrontation, extraocular muscles intact, no nystagmus  5th - facial sensation symmetric  7th - facial strength symmetric  8th - hearing intact  9th - palate elevates symmetrically, uvula midline  11th - shoulder shrug symmetric  12th - tongue protrusion midline  MOTOR:   normal bulk and tone, full strength in the  BUE, BLE; LIMITED ROM IN KNEES  SENSORY:   normal and symmetric to light touch, temperature, vibration  COORDINATION:   finger-nose-finger, fine finger movements normal  REFLEXES:   deep tendon reflexes present and symmetric  GAIT/STATION:   narrow based gait; USING  CANE     DIAGNOSTIC DATA (LABS, IMAGING, TESTING) - I reviewed patient records, labs, notes, testing and imaging myself where available.  Lab Results  Component Value Date   WBC 16.3 (H) 05/07/2019   HGB 13.6 05/07/2019   HCT 39.2 05/07/2019   MCV 88.7 05/07/2019   PLT 170 05/07/2019      Component Value Date/Time   NA 131 (L) 05/07/2019 2035   K 3.3 (L) 05/07/2019 2035   CL 93 (L) 05/07/2019 2035   CO2 24 05/07/2019 2035   GLUCOSE 95 05/07/2019 2035   BUN 17 05/07/2019 2035   CREATININE 0.78 05/07/2019 2035   CALCIUM 9.5 05/07/2019 2035   PROT 7.6 08/18/2016 1621   ALBUMIN 4.7 08/18/2016 1621   AST 26 08/18/2016 1621   ALT 35 08/18/2016 1621   ALKPHOS 64 08/18/2016 1621   BILITOT 0.8 08/18/2016 1621   GFRNONAA >60 05/07/2019 2035   GFRAA >60 05/07/2019 2035   Lab Results  Component Value Date   CHOL 264 (H) 08/19/2016   HDL 45.90 08/19/2016   LDLCALC 180 (H) 08/19/2016   LDLDIRECT 178.5 12/06/2013   TRIG 189.0 (H) 08/19/2016   CHOLHDL 6 08/19/2016   Lab Results  Component Value Date   HGBA1C 5.5 08/19/2016   Lab Results  Component Value Date   VITAMINB12 359 12/23/2010   Lab Results  Component Value Date   TSH 2.06 08/19/2016    08/08/19 MRI brain - Normal MRI brain (with and without).    ASSESSMENT AND PLAN  49 y.o. year old female here with dizziness, lightheadedness, insomnia, anxiety, history of migraine.  Patient may have had a benign positional vertigo attack in June 2020 which has improved.  Now feeling symptoms more related to intermittent migraine and anxiety resulting in nonspecific lightheadedness, dizziness, feeling overwhelmed with certain situations such as driving and going to large stores.  Reassured patient with normal neurologic examination today.      Dx:  1. Insomnia, unspecified type   2. Dizziness   3. Anxiety      PLAN:  DIZZINESS / LIGHTHEADEDNESS (with anxiety features; some mild migraine features) - may  discontinue amitriptyline (not that effective) - reviewed sleep, anxiety, exercise, desensitization treatments - recommend to follow up with psychiatry / psychology  Return for return to PCP.    Penni Bombard, MD 16/60/6301, 6:01 PM Certified in Neurology, Neurophysiology and Neuroimaging  [facility] [address], [place],  [address] [phone]

## 2020-01-24 NOTE — Patient Instructions (Signed)
DIZZINESS / LIGHTHEADEDNESS (with anxiety features; some mild migraine features) - may discontinue amitriptyline (not that effective) - reviewed sleep, anxiety, exercise, desensitization treatments - recommend to follow up with psychiatry / psychology

## 2020-09-02 ENCOUNTER — Other Ambulatory Visit: Payer: Self-pay | Admitting: Physician Assistant

## 2020-09-02 DIAGNOSIS — Z1231 Encounter for screening mammogram for malignant neoplasm of breast: Secondary | ICD-10-CM

## 2020-10-14 ENCOUNTER — Telehealth: Payer: Self-pay | Admitting: Diagnostic Neuroimaging

## 2020-10-14 NOTE — Telephone Encounter (Signed)
Called patient and informed her per Dr Gladstone Lighter Nov note he advised she  discontinue it; it wasn't effective. She stated she did stop it, was sleeping well, now she has increased stress in her life. OTC like Benadryl are not helping. She doesn't want to FU here, wants 10 tablets to take "until she gets her life back together". I advised she needs to contact her PCP. She  verbalized understanding, appreciation.

## 2020-10-14 NOTE — Telephone Encounter (Signed)
Pt called and LVM stating that she has been having trouble sleeping and is wanting to know if she can get a refill on her amitriptyline (ELAVIL) 25 MG tablet Please advise.

## 2021-04-08 ENCOUNTER — Other Ambulatory Visit: Payer: Self-pay | Admitting: Family Medicine

## 2021-04-08 DIAGNOSIS — N6324 Unspecified lump in the left breast, lower inner quadrant: Secondary | ICD-10-CM

## 2021-04-23 ENCOUNTER — Ambulatory Visit
Admission: RE | Admit: 2021-04-23 | Discharge: 2021-04-23 | Disposition: A | Discharge status: Home / Self Care | Payer: BC Managed Care – PPO | Source: Ambulatory Visit | Attending: Family Medicine | Admitting: Family Medicine

## 2021-04-23 DIAGNOSIS — N6324 Unspecified lump in the left breast, lower inner quadrant: Secondary | ICD-10-CM

## 2021-04-30 ENCOUNTER — Other Ambulatory Visit: Payer: BC Managed Care – PPO

## 2022-09-22 ENCOUNTER — Other Ambulatory Visit (HOSPITAL_COMMUNITY): Payer: Self-pay | Admitting: Physician Assistant

## 2022-09-22 DIAGNOSIS — E785 Hyperlipidemia, unspecified: Secondary | ICD-10-CM

## 2022-10-07 ENCOUNTER — Ambulatory Visit (HOSPITAL_COMMUNITY)
Admission: RE | Admit: 2022-10-07 | Discharge: 2022-10-07 | Disposition: A | Discharge status: Home / Self Care | Payer: Self-pay | Source: Ambulatory Visit | Attending: Physician Assistant | Admitting: Physician Assistant

## 2022-10-07 ENCOUNTER — Encounter (HOSPITAL_COMMUNITY): Payer: Self-pay

## 2022-10-07 DIAGNOSIS — E785 Hyperlipidemia, unspecified: Secondary | ICD-10-CM | POA: Insufficient documentation
